# Patient Record
Sex: Male | Born: 1940 | Race: White | Hispanic: No | Marital: Married | Smoking: Never smoker
Health system: Southern US, Community
[De-identification: ages and names within clinical notes are randomized; demographics above are authoritative.]

## PROBLEM LIST (undated history)

## (undated) DIAGNOSIS — E119 Type 2 diabetes mellitus without complications: Secondary | ICD-10-CM

## (undated) DIAGNOSIS — F319 Bipolar disorder, unspecified: Secondary | ICD-10-CM

## (undated) DIAGNOSIS — F419 Anxiety disorder, unspecified: Secondary | ICD-10-CM

## (undated) DIAGNOSIS — F32A Depression, unspecified: Secondary | ICD-10-CM

## (undated) DIAGNOSIS — I10 Essential (primary) hypertension: Secondary | ICD-10-CM

## (undated) DIAGNOSIS — Z87442 Personal history of urinary calculi: Secondary | ICD-10-CM

## (undated) DIAGNOSIS — M1990 Unspecified osteoarthritis, unspecified site: Secondary | ICD-10-CM

## (undated) DIAGNOSIS — F329 Major depressive disorder, single episode, unspecified: Secondary | ICD-10-CM

## (undated) DIAGNOSIS — C4442 Squamous cell carcinoma of skin of scalp and neck: Secondary | ICD-10-CM

## (undated) HISTORY — DX: Anxiety disorder, unspecified: F41.9

## (undated) HISTORY — DX: Essential (primary) hypertension: I10

## (undated) HISTORY — PX: HIP FRACTURE SURGERY: SHX118

## (undated) HISTORY — DX: Depression, unspecified: F32.A

## (undated) HISTORY — PX: VASECTOMY: SHX75

## (undated) HISTORY — PX: HERNIA REPAIR: SHX51

## (undated) HISTORY — DX: Major depressive disorder, single episode, unspecified: F32.9

## (undated) HISTORY — DX: Unspecified osteoarthritis, unspecified site: M19.90

## (undated) HISTORY — PX: BACK SURGERY: SHX140

---

## 2001-10-02 ENCOUNTER — Other Ambulatory Visit (HOSPITAL_COMMUNITY): Admission: RE | Admit: 2001-10-02 | Discharge: 2001-10-13 | Payer: Self-pay | Admitting: Psychiatry

## 2003-03-31 ENCOUNTER — Inpatient Hospital Stay (HOSPITAL_COMMUNITY): Admission: EM | Admit: 2003-03-31 | Discharge: 2003-04-06 | Payer: Self-pay | Admitting: Psychiatry

## 2003-04-07 ENCOUNTER — Other Ambulatory Visit (HOSPITAL_COMMUNITY): Admission: RE | Admit: 2003-04-07 | Discharge: 2003-04-15 | Payer: Self-pay | Admitting: Psychiatry

## 2004-11-24 ENCOUNTER — Inpatient Hospital Stay: Payer: Self-pay | Admitting: Psychiatry

## 2004-12-04 ENCOUNTER — Ambulatory Visit: Payer: Self-pay | Admitting: Psychiatry

## 2009-08-12 ENCOUNTER — Ambulatory Visit (HOSPITAL_COMMUNITY): Admission: RE | Admit: 2009-08-12 | Discharge: 2009-08-12 | Payer: Self-pay | Admitting: Urology
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
## 2010-07-28 NOTE — H&P (Signed)
NAME:  COOPER, MORONEY                          ACCOUNT NO.:  [id number]   MEDICAL RECORD NO.:  [id number]                   PATIENT TYPE:  IPS   LOCATION:  0500                                 FACILITY:  [facility]   PHYSICIAN:  Geoffery Lyons, M.D.                   DATE OF BIRTH:  [birth year]   DATE OF ADMISSION:  03/31/2003  DATE OF DISCHARGE:                                HISTORY & PHYSICAL   IDENTIFYING INFORMATION:  A 70 year old married white male, voluntarily  admitted on March 31, 2003.   HISTORY OF PRESENT ILLNESS:  The patient presents with a history of anger  and homicidal ideation towards his wife with plans to smother her.  He has  been having thoughts the last week to hurt himself while he was on vacation,  jumping off a swinging bridge.  He reports he has been having depressive  anger, demanding and feeling out of line with his behavior.  He states he  was on vacation.  He states while he was there the fireplace was not  working.  He states that he was getting obsessed about getting this fixed.  He was talking to his congressman and the managers trying to get this fixed.  He also states he was at a restaurant with his family.  He states when he  was not served as quick as he thought, he made noise with his glass, he was  yelling at the waitress.  He is having homicidal thoughts towards his wife  because he states that she shows little emotional support.  He is currently  denying any suicidal ideation.  He has lost about 20-30 pounds recently.  The patient is also having problems with his job.  He is currently on FMLA  from his work at [employer].   PAST PSYCHIATRIC HISTORY:  Has been at [facility] where he was  hospitalized for anxiety.  He is currently seeing Dr. Jodi Marble who is a  psychiatrist.   SOCIAL HISTORY:  He is a 70 year old married white male, married for 33  years, first marriage.  Has 2 children, 2 daughters ages 42 and 31.  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  He denies any legal problems.   FAMILY HISTORY:  Hypertension.  He also states that his great aunt was  hospitalized for some time for psychiatric illnesses.  He is unsure of any  more details.   ALCOHOL DRUG HISTORY:  He is a nonsmoker.  He denies any alcohol or drug  use.   PAST MEDICAL HISTORY:  Primary care Grey[facility] Felter is Dr. Fara Chute in [place].  Medical problems are hypertension.   MEDICATIONS:  He is currently on Depakote 1000 mg p.o. q.h.s., Toprol XL 100  mg daily, Ativan and Doxepin.  He was on Ritalin and was taken off about 2  weeks ago.  DRUG ALLERGIES:  PROZAC, the patient reports violent side effects, also  ASPIRIN where he states he gets ringing in his ears.   PHYSICAL EXAMINATION:  Performed.  This is an unkempt middle-aged male,  unshaven, no significant findings.  Neurologically intact, nonfocal  neurological findings.  No tics or tremors.   LABORATORY DATA:  Glucose was 125, CBC is within normal limits.  TSH is  0.822.   MENTAL STATUS EXAM:  He is an alert, unkempt middle-aged male, cooperative,  direct eye contact.  Speech is clear, rambling some.  Mood:  The patient  feels very angry.  He states that at one time he went to put his fist  through a wall.  His affect is full range of emotions, somewhat labile,  irritable and then apologetic, smiling inappropriately at times.  Thought  processes are coherent, appears to be no evidence of psychosis.  He is  obsessing over the fact that the fireplace is not working right while he is  on vacation.  Cognitive intact, decreased concentration,  Judgment is fair,  insight is fair.   ADMISSION DIAGNOSES:   AXIS I:  Bipolar disorder.   AXIS II:  Deferred.   AXIS III:  Hypertension.   AXIS IV:  Problems with primary support group, occupation, other  psychosocial problems.   AXIS V:  Current is 30, past year 70.   PLAN:  Voluntary admission for homicidal  ideation.  Contract for safety,  check every 15 minutes.  We will stabilize mood and thinking so the patient  can be safe.  Will continue with Depakote, check Depakote levels for  therapeutic range.  Will contact Dr. Jodi Marble for any recommendations.  We  will initiate an anti-psychotic for obsessive thinking, anger and mood  stability.  We will have a family session prior to discharge.   TENTATIVE LENGTH OF CARE:  3-5 days.     Landry Corporal, N.P.                       Geoffery Lyons, M.D.    JO/MEDQ  D:  04/01/2003  T:  04/01/2003  Job:  [id number]

## 2016-06-27 DIAGNOSIS — S40861A Insect bite (nonvenomous) of right upper arm, initial encounter: Secondary | ICD-10-CM | POA: Diagnosis not present

## 2016-06-27 DIAGNOSIS — N183 Chronic kidney disease, stage 3 (moderate): Secondary | ICD-10-CM | POA: Diagnosis not present

## 2016-06-27 DIAGNOSIS — E782 Mixed hyperlipidemia: Secondary | ICD-10-CM | POA: Diagnosis not present

## 2016-06-27 DIAGNOSIS — I10 Essential (primary) hypertension: Secondary | ICD-10-CM | POA: Diagnosis not present

## 2016-06-27 DIAGNOSIS — L03113 Cellulitis of right upper limb: Secondary | ICD-10-CM | POA: Diagnosis not present

## 2016-06-27 DIAGNOSIS — E119 Type 2 diabetes mellitus without complications: Secondary | ICD-10-CM | POA: Diagnosis not present

## 2016-07-03 DIAGNOSIS — N183 Chronic kidney disease, stage 3 (moderate): Secondary | ICD-10-CM | POA: Diagnosis not present

## 2016-07-03 DIAGNOSIS — I10 Essential (primary) hypertension: Secondary | ICD-10-CM | POA: Diagnosis not present

## 2016-07-03 DIAGNOSIS — R634 Abnormal weight loss: Secondary | ICD-10-CM | POA: Diagnosis not present

## 2016-07-03 DIAGNOSIS — G3184 Mild cognitive impairment, so stated: Secondary | ICD-10-CM | POA: Diagnosis not present

## 2016-07-03 DIAGNOSIS — G20 Parkinson's disease: Secondary | ICD-10-CM | POA: Diagnosis not present

## 2016-07-03 DIAGNOSIS — E782 Mixed hyperlipidemia: Secondary | ICD-10-CM | POA: Diagnosis not present

## 2016-07-03 DIAGNOSIS — K8080 Other cholelithiasis without obstruction: Secondary | ICD-10-CM | POA: Diagnosis not present

## 2016-07-03 DIAGNOSIS — F3175 Bipolar disorder, in partial remission, most recent episode depressed: Secondary | ICD-10-CM | POA: Diagnosis not present

## 2016-07-24 DIAGNOSIS — J0101 Acute recurrent maxillary sinusitis: Secondary | ICD-10-CM | POA: Diagnosis not present

## 2016-07-24 DIAGNOSIS — J069 Acute upper respiratory infection, unspecified: Secondary | ICD-10-CM | POA: Diagnosis not present

## 2016-07-24 DIAGNOSIS — Z6830 Body mass index (BMI) 30.0-30.9, adult: Secondary | ICD-10-CM | POA: Diagnosis not present

## 2016-12-25 DIAGNOSIS — N183 Chronic kidney disease, stage 3 (moderate): Secondary | ICD-10-CM | POA: Diagnosis not present

## 2016-12-25 DIAGNOSIS — F3175 Bipolar disorder, in partial remission, most recent episode depressed: Secondary | ICD-10-CM | POA: Diagnosis not present

## 2016-12-25 DIAGNOSIS — E782 Mixed hyperlipidemia: Secondary | ICD-10-CM | POA: Diagnosis not present

## 2016-12-25 DIAGNOSIS — R7301 Impaired fasting glucose: Secondary | ICD-10-CM | POA: Diagnosis not present

## 2016-12-25 DIAGNOSIS — G20 Parkinson's disease: Secondary | ICD-10-CM | POA: Diagnosis not present

## 2016-12-25 DIAGNOSIS — I10 Essential (primary) hypertension: Secondary | ICD-10-CM | POA: Diagnosis not present

## 2016-12-25 DIAGNOSIS — E7800 Pure hypercholesterolemia, unspecified: Secondary | ICD-10-CM | POA: Diagnosis not present

## 2016-12-28 DIAGNOSIS — K8080 Other cholelithiasis without obstruction: Secondary | ICD-10-CM | POA: Diagnosis not present

## 2016-12-28 DIAGNOSIS — R7301 Impaired fasting glucose: Secondary | ICD-10-CM | POA: Diagnosis not present

## 2016-12-28 DIAGNOSIS — R634 Abnormal weight loss: Secondary | ICD-10-CM | POA: Diagnosis not present

## 2016-12-28 DIAGNOSIS — N401 Enlarged prostate with lower urinary tract symptoms: Secondary | ICD-10-CM | POA: Diagnosis not present

## 2016-12-28 DIAGNOSIS — I10 Essential (primary) hypertension: Secondary | ICD-10-CM | POA: Diagnosis not present

## 2016-12-28 DIAGNOSIS — F3175 Bipolar disorder, in partial remission, most recent episode depressed: Secondary | ICD-10-CM | POA: Diagnosis not present

## 2016-12-28 DIAGNOSIS — Z0001 Encounter for general adult medical examination with abnormal findings: Secondary | ICD-10-CM | POA: Diagnosis not present

## 2016-12-28 DIAGNOSIS — N183 Chronic kidney disease, stage 3 (moderate): Secondary | ICD-10-CM | POA: Diagnosis not present

## 2016-12-28 DIAGNOSIS — G3184 Mild cognitive impairment, so stated: Secondary | ICD-10-CM | POA: Diagnosis not present

## 2016-12-28 DIAGNOSIS — G20 Parkinson's disease: Secondary | ICD-10-CM | POA: Diagnosis not present

## 2016-12-28 DIAGNOSIS — R5383 Other fatigue: Secondary | ICD-10-CM | POA: Diagnosis not present

## 2016-12-28 DIAGNOSIS — E782 Mixed hyperlipidemia: Secondary | ICD-10-CM | POA: Diagnosis not present

## 2017-05-21 DIAGNOSIS — E1122 Type 2 diabetes mellitus with diabetic chronic kidney disease: Secondary | ICD-10-CM | POA: Diagnosis not present

## 2017-05-21 DIAGNOSIS — E7800 Pure hypercholesterolemia, unspecified: Secondary | ICD-10-CM | POA: Diagnosis not present

## 2017-05-21 DIAGNOSIS — N183 Chronic kidney disease, stage 3 (moderate): Secondary | ICD-10-CM | POA: Diagnosis not present

## 2017-05-21 DIAGNOSIS — R7301 Impaired fasting glucose: Secondary | ICD-10-CM | POA: Diagnosis not present

## 2017-05-21 DIAGNOSIS — E782 Mixed hyperlipidemia: Secondary | ICD-10-CM | POA: Diagnosis not present

## 2017-05-21 DIAGNOSIS — G20 Parkinson's disease: Secondary | ICD-10-CM | POA: Diagnosis not present

## 2017-05-21 DIAGNOSIS — I10 Essential (primary) hypertension: Secondary | ICD-10-CM | POA: Diagnosis not present

## 2017-05-21 DIAGNOSIS — R5383 Other fatigue: Secondary | ICD-10-CM | POA: Diagnosis not present

## 2017-05-21 DIAGNOSIS — F3175 Bipolar disorder, in partial remission, most recent episode depressed: Secondary | ICD-10-CM | POA: Diagnosis not present

## 2017-05-24 DIAGNOSIS — R634 Abnormal weight loss: Secondary | ICD-10-CM | POA: Diagnosis not present

## 2017-05-24 DIAGNOSIS — F3175 Bipolar disorder, in partial remission, most recent episode depressed: Secondary | ICD-10-CM | POA: Diagnosis not present

## 2017-05-24 DIAGNOSIS — E782 Mixed hyperlipidemia: Secondary | ICD-10-CM | POA: Diagnosis not present

## 2017-05-24 DIAGNOSIS — Z23 Encounter for immunization: Secondary | ICD-10-CM | POA: Diagnosis not present

## 2017-05-24 DIAGNOSIS — G20 Parkinson's disease: Secondary | ICD-10-CM | POA: Diagnosis not present

## 2017-05-24 DIAGNOSIS — I10 Essential (primary) hypertension: Secondary | ICD-10-CM | POA: Diagnosis not present

## 2017-05-24 DIAGNOSIS — Z6831 Body mass index (BMI) 31.0-31.9, adult: Secondary | ICD-10-CM | POA: Diagnosis not present

## 2017-05-24 DIAGNOSIS — G3184 Mild cognitive impairment, so stated: Secondary | ICD-10-CM | POA: Diagnosis not present

## 2017-11-13 DIAGNOSIS — L02811 Cutaneous abscess of head [any part, except face]: Secondary | ICD-10-CM | POA: Diagnosis not present

## 2017-11-13 DIAGNOSIS — Z6829 Body mass index (BMI) 29.0-29.9, adult: Secondary | ICD-10-CM | POA: Diagnosis not present

## 2017-11-21 DIAGNOSIS — R634 Abnormal weight loss: Secondary | ICD-10-CM | POA: Diagnosis not present

## 2017-11-21 DIAGNOSIS — E1122 Type 2 diabetes mellitus with diabetic chronic kidney disease: Secondary | ICD-10-CM | POA: Diagnosis not present

## 2017-11-21 DIAGNOSIS — E782 Mixed hyperlipidemia: Secondary | ICD-10-CM | POA: Diagnosis not present

## 2017-11-21 DIAGNOSIS — R7301 Impaired fasting glucose: Secondary | ICD-10-CM | POA: Diagnosis not present

## 2017-11-21 DIAGNOSIS — R5383 Other fatigue: Secondary | ICD-10-CM | POA: Diagnosis not present

## 2017-11-21 DIAGNOSIS — N183 Chronic kidney disease, stage 3 (moderate): Secondary | ICD-10-CM | POA: Diagnosis not present

## 2017-11-21 DIAGNOSIS — I10 Essential (primary) hypertension: Secondary | ICD-10-CM | POA: Diagnosis not present

## 2017-11-21 DIAGNOSIS — E7800 Pure hypercholesterolemia, unspecified: Secondary | ICD-10-CM | POA: Diagnosis not present

## 2017-11-27 DIAGNOSIS — Z23 Encounter for immunization: Secondary | ICD-10-CM | POA: Diagnosis not present

## 2017-11-27 DIAGNOSIS — R634 Abnormal weight loss: Secondary | ICD-10-CM | POA: Diagnosis not present

## 2017-11-27 DIAGNOSIS — N183 Chronic kidney disease, stage 3 (moderate): Secondary | ICD-10-CM | POA: Diagnosis not present

## 2017-11-27 DIAGNOSIS — G20 Parkinson's disease: Secondary | ICD-10-CM | POA: Diagnosis not present

## 2017-11-27 DIAGNOSIS — E782 Mixed hyperlipidemia: Secondary | ICD-10-CM | POA: Diagnosis not present

## 2017-11-27 DIAGNOSIS — G3184 Mild cognitive impairment, so stated: Secondary | ICD-10-CM | POA: Diagnosis not present

## 2017-11-27 DIAGNOSIS — Z6829 Body mass index (BMI) 29.0-29.9, adult: Secondary | ICD-10-CM | POA: Diagnosis not present

## 2017-11-27 DIAGNOSIS — I10 Essential (primary) hypertension: Secondary | ICD-10-CM | POA: Diagnosis not present

## 2017-12-04 DIAGNOSIS — C4442 Squamous cell carcinoma of skin of scalp and neck: Secondary | ICD-10-CM | POA: Diagnosis not present

## 2017-12-04 DIAGNOSIS — L0100 Impetigo, unspecified: Secondary | ICD-10-CM | POA: Diagnosis not present

## 2017-12-04 DIAGNOSIS — D485 Neoplasm of uncertain behavior of skin: Secondary | ICD-10-CM | POA: Diagnosis not present

## 2017-12-09 DIAGNOSIS — L0100 Impetigo, unspecified: Secondary | ICD-10-CM | POA: Diagnosis not present

## 2017-12-09 DIAGNOSIS — C4442 Squamous cell carcinoma of skin of scalp and neck: Secondary | ICD-10-CM | POA: Diagnosis not present

## 2017-12-11 DIAGNOSIS — C4442 Squamous cell carcinoma of skin of scalp and neck: Secondary | ICD-10-CM | POA: Diagnosis not present

## 2017-12-17 ENCOUNTER — Encounter: Payer: Self-pay | Admitting: Plastic Surgery

## 2017-12-17 ENCOUNTER — Ambulatory Visit (INDEPENDENT_AMBULATORY_CARE_PROVIDER_SITE_OTHER): Payer: PPO | Admitting: Plastic Surgery

## 2017-12-17 VITALS — BP 118/62 | HR 85 | Resp 12 | Ht 70.0 in | Wt 196.0 lb

## 2017-12-17 DIAGNOSIS — D485 Neoplasm of uncertain behavior of skin: Secondary | ICD-10-CM | POA: Diagnosis not present

## 2017-12-17 DIAGNOSIS — C4442 Squamous cell carcinoma of skin of scalp and neck: Secondary | ICD-10-CM | POA: Diagnosis not present

## 2017-12-17 DIAGNOSIS — E041 Nontoxic single thyroid nodule: Secondary | ICD-10-CM | POA: Diagnosis not present

## 2017-12-17 NOTE — Addendum Note (Signed)
Addended by: Wallace Going on: 12/17/2017 02:47 PM   Modules accepted: Orders

## 2017-12-17 NOTE — Progress Notes (Addendum)
     Patient ID: Patrick Coleman, male    DOB: 1940-10-07, 77 y.o.   MRN: [id number]   Chief Complaint  Patient presents with  . 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  He has noticed a sore area on the superior posterior portion of the scalp for several months.  It was getting more sore and tender.  It was biopsied and found to be a SCC with concern that it was tethered to the bone.  The area is now 3 x 3 cm in size with rolled edges. It is tender to touch.  He is keeping it covered at the moment.  His wife is helping to keep it clean.  There is no sign of inflammed or tender cervical lymph nodes.   History and sheet is scanned.   Review of Systems  Constitutional: Negative.  Negative for activity change and appetite change.  HENT: Negative.   Eyes: Negative.   Respiratory: Negative.  Negative for shortness of breath.   Cardiovascular: Negative.   Gastrointestinal: Negative.   Genitourinary: Negative.   Musculoskeletal: Negative.  Negative for back pain.  Skin: Positive for color change and wound.  Neurological: Negative for dizziness, facial asymmetry, speech difficulty, light-headedness and headaches.  Hematological: Negative.   Psychiatric/Behavioral: Negative.     History reviewed. No pertinent past medical history.  History reviewed. No pertinent surgical history.    Current Outpatient Medications:  .  doxazosin (CARDURA) 4 MG tablet, Take 4 mg by mouth at bedtime., Disp: , Rfl: 4 .  doxycycline (VIBRA-TABS) 100 MG tablet, TAKE 1 TABLET BY MOUTH TWICE DAILY WITH FOOD FOR FIVE DAYS THEN ONE TABLET BY MOUTH FOR 20 DAYS, Disp: , Rfl: 0 .  lamoTRIgine (LAMICTAL) 150 MG tablet, Take 150 mg by mouth at bedtime., Disp: , Rfl: 3 .  lithium carbonate (ESKALITH) 450 MG CR tablet, Take 450 mg by mouth daily., Disp: , Rfl: 4 .  metoprolol tartrate (LOPRESSOR) 25 MG tablet, Take 25 mg by mouth daily.,  Disp: , Rfl: 7 .  OLANZapine (ZYPREXA) 10 MG tablet, TAKE 1 AND 1/2 TABLETS BY MOUTH AT BEDTIME, Disp: , Rfl: 3 .  sertraline (ZOLOFT) 50 MG tablet, Take 50 mg by mouth daily., Disp: , Rfl: 3 .  tamsulosin (FLOMAX) 0.4 MG CAPS capsule, Take 0.4 mg by mouth daily., Disp: , Rfl: 3   Objective:   Vitals:   12/17/17 1141  BP: 118/62  Pulse: 85  Resp: 12  SpO2: 96%    Physical Exam  Constitutional: He is oriented to person, place, and time. He appears well-developed.  HENT:  Head: Normocephalic.    Right Ear: External ear normal.  Left Ear: External ear normal.  Eyes: Pupils are equal, round, and reactive to light.  Cardiovascular: Normal rate.  Pulmonary/Chest: Effort normal. No respiratory distress.  Neurological: He is alert and oriented to person, place, and time.  Skin: Skin is warm.  Psychiatric: He has a normal mood and affect. His behavior is normal. Judgment and thought content normal.    Assessment & Plan:  Squamous cell carcinoma of scalp  Recommend excision of the area with outer table excision, path evaluation.  Acell for aid in healing and then likely skin graft.  Family and patient in agreement.  Discussed with Dr. Danielle Dess.  Will get CT of head and neck.  Bricelyn, DO

## 2017-12-24 ENCOUNTER — Other Ambulatory Visit: Payer: Self-pay | Admitting: Plastic Surgery

## 2017-12-24 ENCOUNTER — Ambulatory Visit (HOSPITAL_COMMUNITY)
Admission: RE | Admit: 2017-12-24 | Discharge: 2017-12-24 | Disposition: A | Discharge status: Home / Self Care | Payer: PPO | Source: Ambulatory Visit | Attending: Plastic Surgery | Admitting: Plastic Surgery

## 2017-12-24 DIAGNOSIS — D485 Neoplasm of uncertain behavior of skin: Secondary | ICD-10-CM

## 2017-12-24 DIAGNOSIS — E0789 Other specified disorders of thyroid: Secondary | ICD-10-CM | POA: Diagnosis not present

## 2017-12-24 DIAGNOSIS — M542 Cervicalgia: Secondary | ICD-10-CM | POA: Insufficient documentation

## 2017-12-24 DIAGNOSIS — C4442 Squamous cell carcinoma of skin of scalp and neck: Secondary | ICD-10-CM | POA: Diagnosis not present

## 2017-12-24 LAB — POCT I-STAT CREATININE: CREATININE: 1.3 mg/dL — AB (ref 0.61–1.24)

## 2017-12-24 MED ORDER — IOHEXOL 300 MG/ML  SOLN
75.0000 mL | Freq: Once | INTRAMUSCULAR | Status: AC | PRN
  Administered 2017-12-24: 75 mL via INTRAVENOUS

## 2017-12-26 NOTE — Addendum Note (Signed)
Addended by: Wallace Going on: 12/26/2017 08:02 AM   Modules accepted: Orders

## 2018-01-01 ENCOUNTER — Telehealth: Payer: Self-pay | Admitting: Plastic Surgery

## 2018-01-01 NOTE — Telephone Encounter (Signed)
Made in error

## 2018-01-03 ENCOUNTER — Ambulatory Visit (INDEPENDENT_AMBULATORY_CARE_PROVIDER_SITE_OTHER): Payer: PPO | Admitting: Plastic Surgery

## 2018-01-03 ENCOUNTER — Encounter: Payer: Self-pay | Admitting: Plastic Surgery

## 2018-01-03 VITALS — BP 120/76 | HR 94 | Resp 14

## 2018-01-03 DIAGNOSIS — C4442 Squamous cell carcinoma of skin of scalp and neck: Secondary | ICD-10-CM

## 2018-01-03 DIAGNOSIS — E041 Nontoxic single thyroid nodule: Secondary | ICD-10-CM | POA: Insufficient documentation

## 2018-01-03 NOTE — Progress Notes (Signed)
   Subjective:    Patient ID: Patrick Coleman, male    DOB: 1940/05/19, 77 y.o.   MRN: [id number]  The patient is a 77 year old male here with his wife for follow-up after further evaluation of his scalp squamous cell carcinoma.  He underwent a CT of head and neck and fortunately it was negative for neck or nodal involvement.  He did have a lesion noted on his thyroid.  We will continue to work this up as well and have ordered a ultrasound.  It is approximately the same size 3 x 3 it still has a little bit of ooze with raised borders and an ulcerated central portion.  He has been doing well otherwise and not had any recent illnesses.  He denies any other symptoms.  Review of Systems  Constitutional: Negative.  Negative for activity change.  HENT: Negative.   Respiratory: Negative.   Cardiovascular: Negative.   Gastrointestinal: Negative.   Endocrine: Negative.   Genitourinary: Negative.   Musculoskeletal: Negative.   Skin: Positive for color change and wound.  Neurological: Negative.   Hematological: Negative.   Psychiatric/Behavioral: Negative.        Objective:   Physical Exam  Constitutional: He is oriented to person, place, and time. He appears well-developed and well-nourished.  HENT:  Head: Normocephalic.  Eyes: Pupils are equal, round, and reactive to light. EOM are normal.  Cardiovascular: Normal rate.  Pulmonary/Chest: Effort normal.  Neurological: He is alert and oriented to person, place, and time.  Skin: Skin is warm.  Psychiatric: He has a normal mood and affect. His behavior is normal.      Assessment & Plan:  Squamous cell carcinoma of scalp  Thyroid nodule Plan for excision of the squamous cell carcinoma with past evaluation.  We will plan on placing acell for granulation tissue formation while awaiting the final path.  Patient is in agreement with this plan we will likely use a 5 x 5 cm piece of ACell with a gram of powder

## 2018-01-03 NOTE — H&P (View-Only) (Signed)
   Subjective:    Patient ID: Patrick Coleman, male    DOB: 1940-12-26, 77 y.o.   MRN: [id number]  The patient is a 77 year old male here with his wife for follow-up after further evaluation of his scalp squamous cell carcinoma.  He underwent a CT of head and neck and fortunately it was negative for neck or nodal involvement.  He did have a lesion noted on his thyroid.  We will continue to work this up as well and have ordered a ultrasound.  It is approximately the same size 3 x 3 it still has a little bit of ooze with raised borders and an ulcerated central portion.  He has been doing well otherwise and not had any recent illnesses.  He denies any other symptoms.  Review of Systems  Constitutional: Negative.  Negative for activity change.  HENT: Negative.   Respiratory: Negative.   Cardiovascular: Negative.   Gastrointestinal: Negative.   Endocrine: Negative.   Genitourinary: Negative.   Musculoskeletal: Negative.   Skin: Positive for color change and wound.  Neurological: Negative.   Hematological: Negative.   Psychiatric/Behavioral: Negative.        Objective:   Physical Exam  Constitutional: He is oriented to person, place, and time. He appears well-developed and well-nourished.  HENT:  Head: Normocephalic.  Eyes: Pupils are equal, round, and reactive to light. EOM are normal.  Cardiovascular: Normal rate.  Pulmonary/Chest: Effort normal.  Neurological: He is alert and oriented to person, place, and time.  Skin: Skin is warm.  Psychiatric: He has a normal mood and affect. His behavior is normal.      Assessment & Plan:  Squamous cell carcinoma of scalp  Thyroid nodule Plan for excision of the squamous cell carcinoma with past evaluation.  We will plan on placing acell for granulation tissue formation while awaiting the final path.  Patient is in agreement with this plan we will likely use a 5 x 5 cm piece of ACell with a gram of powder

## 2018-01-07 NOTE — Pre-Procedure Instructions (Signed)
Patrick Coleman  01/07/2018      [facility] - [address], [place], [place] [address] [place] [address] Phone: [phone] Fax: [phone]    Your procedure is scheduled on November 4th.  Report to [facility] Admitting at 1100 A.M.  Call this number if you have problems the morning of surgery:  [phone]   Remember:  Do not eat or drink after midnight.     Take these medicines the morning of surgery with A SIP OF WATER   buPROPion (WELLBUTRIN SR)  carbidopa-levodopa (SINEMET IR)   metoprolol tartrate (LOPRESSOR)  sertraline (ZOLOFT)  7 days prior to surgery STOP taking any Aspirin(unless otherwise instructed by your surgeon), Aleve, Naproxen, Ibuprofen, Motrin, Advil, Goody's, BC's, all herbal medications, fish oil, and all vitamins    Do not wear jewelry.  Do not wear lotions, powders, or colognes, or deodorant.  Men may shave face and neck.  Do not bring valuables to the hospital.  [facility] is not responsible for any belongings or valuables.  Contacts, dentures or bridgework may not be worn into surgery.  Leave your suitcase in the car.  After surgery it may be brought to your room.  For patients admitted to the hospital, discharge time will be determined by your treatment team.  Patients discharged the day of surgery will not be allowed to drive home.    [place]- Preparing For Surgery  Before surgery, you can play an important role. Because skin is not sterile, your skin needs to be as free of germs as possible. You can reduce the number of germs on your skin by washing with CHG (chlorahexidine gluconate) Soap before surgery.  CHG is an antiseptic cleaner which kills germs and bonds with the skin to continue killing germs even after washing.    Oral Hygiene is also important to reduce your risk of infection.  Remember - BRUSH YOUR TEETH THE MORNING OF SURGERY WITH YOUR REGULAR TOOTHPASTE  Please do not use if  you have an allergy to CHG or antibacterial soaps. If your skin becomes reddened/irritated stop using the CHG.  Do not shave (including legs and underarms) for at least 48 hours prior to first CHG shower. It is OK to shave your face.  Please follow these instructions carefully.   1. Shower the NIGHT BEFORE SURGERY and the MORNING OF SURGERY with CHG.   2. If you chose to wash your hair, wash your hair first as usual with your normal shampoo.  3. After you shampoo, rinse your hair and body thoroughly to remove the shampoo.  4. Use CHG as you would any other liquid soap. You can apply CHG directly to the skin and wash gently with a scrungie or a clean washcloth.   5. Apply the CHG Soap to your body ONLY FROM THE NECK DOWN.  Do not use on open wounds or open sores. Avoid contact with your eyes, ears, mouth and genitals (private parts). Wash Face and genitals (private parts)  with your normal soap.  6. Wash thoroughly, paying special attention to the area where your surgery will be performed.  7. Thoroughly rinse your body with warm water from the neck down.  8. DO NOT shower/wash with your normal soap after using and rinsing off the CHG Soap.  9. Pat yourself dry with a CLEAN TOWEL.  10. Wear CLEAN PAJAMAS to bed the night before surgery, wear comfortable clothes the  morning of surgery  11. Place CLEAN SHEETS on your bed the night of your first shower and DO NOT SLEEP WITH PETS.    Day of Surgery:  Do not apply any deodorants/lotions.  Please wear clean clothes to the hospital/surgery center.   Remember to brush your teeth WITH YOUR REGULAR TOOTHPASTE.    Please read over the following fact sheets that you were given.

## 2018-01-08 ENCOUNTER — Other Ambulatory Visit: Payer: Self-pay

## 2018-01-08 ENCOUNTER — Encounter (HOSPITAL_COMMUNITY)
Admission: RE | Admit: 2018-01-08 | Discharge: 2018-01-08 | Disposition: A | Discharge status: Home / Self Care | Payer: PPO | Source: Ambulatory Visit | Attending: Plastic Surgery | Admitting: Plastic Surgery

## 2018-01-08 ENCOUNTER — Encounter (HOSPITAL_COMMUNITY): Payer: Self-pay

## 2018-01-08 DIAGNOSIS — Z01818 Encounter for other preprocedural examination: Secondary | ICD-10-CM | POA: Insufficient documentation

## 2018-01-08 DIAGNOSIS — C4442 Squamous cell carcinoma of skin of scalp and neck: Secondary | ICD-10-CM | POA: Insufficient documentation

## 2018-01-08 DIAGNOSIS — Z79899 Other long term (current) drug therapy: Secondary | ICD-10-CM | POA: Insufficient documentation

## 2018-01-08 DIAGNOSIS — Z7982 Long term (current) use of aspirin: Secondary | ICD-10-CM | POA: Insufficient documentation

## 2018-01-08 HISTORY — DX: Type 2 diabetes mellitus without complications: E11.9

## 2018-01-08 HISTORY — DX: Squamous cell carcinoma of skin of scalp and neck: C44.42

## 2018-01-08 HISTORY — DX: Bipolar disorder, unspecified: F31.9

## 2018-01-08 HISTORY — DX: Personal history of urinary calculi: Z87.442

## 2018-01-08 LAB — CBC
HCT: 45.1 % (ref 39.0–52.0)
Hemoglobin: 14.2 g/dL (ref 13.0–17.0)
MCH: 31.4 pg (ref 26.0–34.0)
MCHC: 31.5 g/dL (ref 30.0–36.0)
MCV: 99.8 fL (ref 80.0–100.0)
NRBC: 0 % (ref 0.0–0.2)
PLATELETS: 126 10*3/uL — AB (ref 150–400)
RBC: 4.52 MIL/uL (ref 4.22–5.81)
RDW: 12.9 % (ref 11.5–15.5)
WBC: 5.3 10*3/uL (ref 4.0–10.5)

## 2018-01-08 LAB — BASIC METABOLIC PANEL
ANION GAP: 7 (ref 5–15)
BUN: 18 mg/dL (ref 8–23)
CALCIUM: 9.4 mg/dL (ref 8.9–10.3)
CO2: 23 mmol/L (ref 22–32)
CREATININE: 1.03 mg/dL (ref 0.61–1.24)
Chloride: 110 mmol/L (ref 98–111)
GFR calc Af Amer: >60 mL/min (ref >60)
Glucose, Bld: 95 mg/dL (ref 70–99)
Potassium: 4.6 mmol/L (ref 3.5–5.1)
SODIUM: 140 mmol/L (ref 135–145)

## 2018-01-08 LAB — GLUCOSE, CAPILLARY: GLUCOSE-CAPILLARY: 81 mg/dL (ref 70–99)

## 2018-01-08 LAB — HEMOGLOBIN A1C
HEMOGLOBIN A1C: 4.8 % (ref 4.8–5.6)
MEAN PLASMA GLUCOSE: 91.06 mg/dL

## 2018-01-08 NOTE — Progress Notes (Addendum)
PCP - Dr. Consuello Masse  Cardiologist - Denies  Chest x-ray - Denies  EKG - 01/08/18  Stress Test - Denies  ECHO - Denies  Cardiac Cath - Denies  AICD- na PM- na LOOP- na  Sleep Study - Yes- Neg- Positive Stop Bang CPAP - None  LABS- 01/08/18: CBC,BMP  ASA- LD- 10/29, hold per Dr. Orma Flaming- 01/08/18 Fasting Blood Sugar - Today, 81 Checks Blood Sugar _0____ times a day- Pt controls with diet  Anesthesia- No  Pt denies having chest pain, sob, or fever at this time. All instructions explained to the pt, with a verbal understanding of the material. Pt agrees to go over the instructions while at home for a better understanding. The opportunity to ask questions was provided.

## 2018-01-08 NOTE — Progress Notes (Signed)
   01/08/18 1010  OBSTRUCTIVE SLEEP APNEA  Have you ever been diagnosed with sleep apnea through a sleep study? No  Do you snore loudly (loud enough to be heard through closed doors)?  1  Do you often feel tired, fatigued, or sleepy during the daytime (such as falling asleep during driving or talking to someone)? 0  Has anyone observed you stop breathing during your sleep? 0  Do you have, or are you being treated for high blood pressure? 1  BMI more than 35 kg/m2? 0  Age > 50 (1-yes) 1  Neck circumference greater than:Male 16 inches or larger, Male 17inches or larger? 1 (58)  Male Gender (Yes=1) 1  Obstructive Sleep Apnea Score 5  Score 5 or greater  Results sent to PCP

## 2018-01-13 ENCOUNTER — Encounter (HOSPITAL_COMMUNITY)
Admission: RE | Disposition: A | Discharge status: Home / Self Care | Payer: Self-pay | Source: Ambulatory Visit | Attending: Plastic Surgery

## 2018-01-13 ENCOUNTER — Ambulatory Visit (HOSPITAL_COMMUNITY): Payer: PPO | Admitting: Anesthesiology

## 2018-01-13 ENCOUNTER — Other Ambulatory Visit: Payer: Self-pay

## 2018-01-13 ENCOUNTER — Ambulatory Visit (HOSPITAL_COMMUNITY)
Admission: RE | Admit: 2018-01-13 | Discharge: 2018-01-13 | Disposition: A | Discharge status: Home / Self Care | Payer: PPO | Source: Ambulatory Visit | Attending: Plastic Surgery | Admitting: Plastic Surgery

## 2018-01-13 ENCOUNTER — Encounter (HOSPITAL_COMMUNITY): Payer: Self-pay

## 2018-01-13 DIAGNOSIS — C4442 Squamous cell carcinoma of skin of scalp and neck: Secondary | ICD-10-CM | POA: Insufficient documentation

## 2018-01-13 DIAGNOSIS — M1990 Unspecified osteoarthritis, unspecified site: Secondary | ICD-10-CM | POA: Insufficient documentation

## 2018-01-13 DIAGNOSIS — I10 Essential (primary) hypertension: Secondary | ICD-10-CM | POA: Insufficient documentation

## 2018-01-13 DIAGNOSIS — E119 Type 2 diabetes mellitus without complications: Secondary | ICD-10-CM | POA: Diagnosis not present

## 2018-01-13 HISTORY — PX: LIPOMA EXCISION: SHX5283

## 2018-01-13 HISTORY — PX: APPLICATION OF A-CELL OF HEAD/NECK: SHX6304

## 2018-01-13 LAB — GLUCOSE, CAPILLARY
GLUCOSE-CAPILLARY: 94 mg/dL (ref 70–99)
Glucose-Capillary: 86 mg/dL (ref 70–99)
Glucose-Capillary: 81 mg/dL (ref 70–99)

## 2018-01-13 SURGERY — EXCISION LIPOMA
Anesthesia: General | Site: Head

## 2018-01-13 MED ORDER — CEFAZOLIN SODIUM-DEXTROSE 2-4 GM/100ML-% IV SOLN
2.0000 g | INTRAVENOUS | Status: AC
  Administered 2018-01-13: 2 g via INTRAVENOUS

## 2018-01-13 MED ORDER — PROMETHAZINE HCL 25 MG/ML IJ SOLN: 6.2500 mg | INTRAMUSCULAR | Status: DC | PRN

## 2018-01-13 MED ORDER — BUPIVACAINE-EPINEPHRINE (PF) 0.25% -1:200000 IJ SOLN
INTRAMUSCULAR | Status: AC
  Filled 2018-01-13: qty 30

## 2018-01-13 MED ORDER — FENTANYL CITRATE (PF) 100 MCG/2ML IJ SOLN: 25.0000 ug | INTRAMUSCULAR | Status: DC | PRN

## 2018-01-13 MED ORDER — EPHEDRINE 5 MG/ML INJ
INTRAVENOUS | Status: AC
  Filled 2018-01-13: qty 10

## 2018-01-13 MED ORDER — ONDANSETRON HCL 4 MG/2ML IJ SOLN
INTRAMUSCULAR | Status: AC
  Filled 2018-01-13: qty 2

## 2018-01-13 MED ORDER — PHENYLEPHRINE HCL 10 MG/ML IJ SOLN
INTRAMUSCULAR | Status: DC | PRN
  Administered 2018-01-13 (×2): 80 ug via INTRAVENOUS

## 2018-01-13 MED ORDER — OXYCODONE HCL 5 MG PO TABS: 5.0000 mg | ORAL_TABLET | ORAL | Status: DC | PRN

## 2018-01-13 MED ORDER — MEPERIDINE HCL 50 MG/ML IJ SOLN: 6.2500 mg | INTRAMUSCULAR | Status: DC | PRN

## 2018-01-13 MED ORDER — BUPIVACAINE-EPINEPHRINE 0.25% -1:200000 IJ SOLN
INTRAMUSCULAR | Status: DC | PRN
  Administered 2018-01-13: 9 mL

## 2018-01-13 MED ORDER — EPHEDRINE SULFATE 50 MG/ML IJ SOLN
INTRAMUSCULAR | Status: DC | PRN
  Administered 2018-01-13 (×3): 10 mg via INTRAVENOUS

## 2018-01-13 MED ORDER — ONDANSETRON HCL 4 MG/2ML IJ SOLN
INTRAMUSCULAR | Status: DC | PRN
  Administered 2018-01-13: 4 mg via INTRAVENOUS

## 2018-01-13 MED ORDER — AMOXICILLIN-POT CLAVULANATE 500-125 MG PO TABS: 1.0000 | ORAL_TABLET | Freq: Three times a day (TID) | ORAL | 0 refills | Status: DC

## 2018-01-13 MED ORDER — HYDROCODONE-ACETAMINOPHEN 5-325 MG PO TABS: 1.0000 | ORAL_TABLET | Freq: Four times a day (QID) | ORAL | 0 refills | Status: AC | PRN

## 2018-01-13 MED ORDER — SODIUM CHLORIDE 0.9% FLUSH: 3.0000 mL | Freq: Two times a day (BID) | INTRAVENOUS | Status: DC

## 2018-01-13 MED ORDER — PROPOFOL 10 MG/ML IV BOLUS
INTRAVENOUS | Status: DC | PRN
  Administered 2018-01-13: 130 mg via INTRAVENOUS

## 2018-01-13 MED ORDER — CEFAZOLIN SODIUM-DEXTROSE 2-4 GM/100ML-% IV SOLN
INTRAVENOUS | Status: AC
  Filled 2018-01-13: qty 100

## 2018-01-13 MED ORDER — ACETAMINOPHEN 500 MG PO TABS: 500.0000 mg | ORAL_TABLET | Freq: Four times a day (QID) | ORAL | Status: DC

## 2018-01-13 MED ORDER — FENTANYL CITRATE (PF) 250 MCG/5ML IJ SOLN
INTRAMUSCULAR | Status: AC
  Filled 2018-01-13: qty 5

## 2018-01-13 MED ORDER — FENTANYL CITRATE (PF) 100 MCG/2ML IJ SOLN
INTRAMUSCULAR | Status: DC | PRN
  Administered 2018-01-13: 50 ug via INTRAVENOUS
  Administered 2018-01-13 (×2): 25 ug via INTRAVENOUS

## 2018-01-13 MED ORDER — PHENYLEPHRINE 40 MCG/ML (10ML) SYRINGE FOR IV PUSH (FOR BLOOD PRESSURE SUPPORT)
PREFILLED_SYRINGE | INTRAVENOUS | Status: AC
  Filled 2018-01-13: qty 10

## 2018-01-13 MED ORDER — LIDOCAINE HCL (CARDIAC) PF 100 MG/5ML IV SOSY
PREFILLED_SYRINGE | INTRAVENOUS | Status: DC | PRN
  Administered 2018-01-13: 100 mg via INTRAVENOUS

## 2018-01-13 MED ORDER — LIDOCAINE 2% (20 MG/ML) 5 ML SYRINGE
INTRAMUSCULAR | Status: AC
  Filled 2018-01-13: qty 5

## 2018-01-13 MED ORDER — SODIUM CHLORIDE 0.9% FLUSH: 3.0000 mL | INTRAVENOUS | Status: DC | PRN

## 2018-01-13 MED ORDER — PROPOFOL 10 MG/ML IV BOLUS
INTRAVENOUS | Status: AC
  Filled 2018-01-13: qty 20

## 2018-01-13 MED ORDER — SODIUM CHLORIDE 0.9 % IV SOLN: 250.0000 mL | INTRAVENOUS | Status: DC | PRN

## 2018-01-13 MED ORDER — BACITRACIN ZINC 500 UNIT/GM EX OINT
TOPICAL_OINTMENT | CUTANEOUS | Status: AC
  Filled 2018-01-13: qty 28.35

## 2018-01-13 MED ORDER — OXYCODONE HCL 5 MG PO TABS: 5.0000 mg | ORAL_TABLET | Freq: Once | ORAL | Status: DC | PRN

## 2018-01-13 MED ORDER — OXYCODONE HCL 5 MG/5ML PO SOLN: 5.0000 mg | Freq: Once | ORAL | Status: DC | PRN

## 2018-01-13 MED ORDER — 0.9 % SODIUM CHLORIDE (POUR BTL) OPTIME
TOPICAL | Status: DC | PRN
  Administered 2018-01-13: 1000 mL

## 2018-01-13 MED ORDER — LIDOCAINE-EPINEPHRINE 1 %-1:100000 IJ SOLN
INTRAMUSCULAR | Status: AC
  Filled 2018-01-13: qty 1

## 2018-01-13 MED ORDER — GLYCOPYRROLATE PF 0.2 MG/ML IJ SOSY
PREFILLED_SYRINGE | INTRAMUSCULAR | Status: AC
  Filled 2018-01-13: qty 1

## 2018-01-13 MED ORDER — LACTATED RINGERS IV SOLN
INTRAVENOUS | Status: DC
  Administered 2018-01-13: 11:00:00 via INTRAVENOUS

## 2018-01-13 SURGICAL SUPPLY — 51 items
BLADE 10 SAFETY STRL DISP (BLADE) IMPLANT
BNDG CONFORM 2 STRL LF (GAUZE/BANDAGES/DRESSINGS) IMPLANT
BNDG GAUZE ELAST 4 BULKY (GAUZE/BANDAGES/DRESSINGS) IMPLANT
CANISTER SUCT 3000ML PPV (MISCELLANEOUS) ×3 IMPLANT
CLEANER TIP ELECTROSURG 2X2 (MISCELLANEOUS) ×3 IMPLANT
CONT SPEC 4OZ CLIKSEAL STRL BL (MISCELLANEOUS) ×3 IMPLANT
COVER SURGICAL LIGHT HANDLE (MISCELLANEOUS) ×3 IMPLANT
COVER WAND RF STERILE (DRAPES) ×3 IMPLANT
DRSG EMULSION OIL 3X3 NADH (GAUZE/BANDAGES/DRESSINGS) IMPLANT
ELECT CAUTERY BLADE 6.4 (BLADE) ×3 IMPLANT
ELECT COATED BLADE 2.86 ST (ELECTRODE) IMPLANT
ELECT NEEDLE BLADE 2-5/6 (NEEDLE) ×3 IMPLANT
ELECT NEEDLE TIP 2.8 STRL (NEEDLE) IMPLANT
ELECT REM PT RETURN 9FT ADLT (ELECTROSURGICAL) ×3
ELECTRODE REM PT RTRN 9FT ADLT (ELECTROSURGICAL) ×1 IMPLANT
GAUZE 4X4 16PLY RFD (DISPOSABLE) IMPLANT
GAUZE SPONGE 4X4 12PLY STRL (GAUZE/BANDAGES/DRESSINGS) ×3 IMPLANT
GAUZE SPONGE 4X4 12PLY STRL LF (GAUZE/BANDAGES/DRESSINGS) ×3 IMPLANT
GLOVE BIO SURGEON STRL SZ 6.5 (GLOVE) ×6 IMPLANT
GLOVE BIO SURGEONS STRL SZ 6.5 (GLOVE) ×3
GOWN STRL REUS W/ TWL LRG LVL3 (GOWN DISPOSABLE) ×2 IMPLANT
GOWN STRL REUS W/TWL LRG LVL3 (GOWN DISPOSABLE) ×4
KIT BASIN OR (CUSTOM PROCEDURE TRAY) ×3 IMPLANT
KIT TURNOVER KIT B (KITS) ×3 IMPLANT
MATRIX WOUND 3-LAYER 7X10 (Tissue) ×2 IMPLANT
MICROMATRIX 1000MG (Tissue) ×3 IMPLANT
NEEDLE HYPO 25GX1X1/2 BEV (NEEDLE) ×3 IMPLANT
NS IRRIG 1000ML POUR BTL (IV SOLUTION) ×3 IMPLANT
PACK SURGICAL SETUP 50X90 (CUSTOM PROCEDURE TRAY) ×3 IMPLANT
PAD ABD 8X10 STRL (GAUZE/BANDAGES/DRESSINGS) ×3 IMPLANT
PAD ARMBOARD 7.5X6 YLW CONV (MISCELLANEOUS) ×6 IMPLANT
PENCIL BUTTON HOLSTER BLD 10FT (ELECTRODE) ×3 IMPLANT
SOLUTION PARTIC MCRMTRX 1000MG (Tissue) ×1 IMPLANT
SPECIMEN JAR SMALL (MISCELLANEOUS) ×3 IMPLANT
SPONGE LAP 18X18 X RAY DECT (DISPOSABLE) IMPLANT
SUT CHROMIC 4 0 P 3 18 (SUTURE) ×3 IMPLANT
SUT ETHILON 4 0 PS 2 18 (SUTURE) ×3 IMPLANT
SUT ETHILON 5 0 P 3 18 (SUTURE) ×2
SUT NYLON ETHILON 5-0 P-3 1X18 (SUTURE) ×1 IMPLANT
SUT SILK 4 0 (SUTURE) ×2
SUT SILK 4-0 18XBRD TIE 12 (SUTURE) ×1 IMPLANT
SUT VIC AB 5-0 PS2 18 (SUTURE) ×9 IMPLANT
SYR BULB 3OZ (MISCELLANEOUS) IMPLANT
SYR BULB IRRIGATION 50ML (SYRINGE) IMPLANT
SYR CONTROL 10ML LL (SYRINGE) IMPLANT
TOWEL OR 17X24 6PK STRL BLUE (TOWEL DISPOSABLE) ×3 IMPLANT
TOWEL OR 17X26 10 PK STRL BLUE (TOWEL DISPOSABLE) ×3 IMPLANT
TRAY ENT MC OR (CUSTOM PROCEDURE TRAY) ×3 IMPLANT
WATER STERILE IRR 1000ML POUR (IV SOLUTION) ×6 IMPLANT
WOUND MATRIX 3-LAYER 7X10 (Tissue) ×1 IMPLANT
YANKAUER SUCT BULB TIP NO VENT (SUCTIONS) ×3 IMPLANT

## 2018-01-13 NOTE — Interval H&P Note (Signed)
History and Physical Interval Note:  01/13/2018 11:56 AM  Patrick Coleman  has presented today for surgery, with the diagnosis of Squamous Cell Carcinoma Of Scalp, thyroid nodule  The various methods of treatment have been discussed with the patient and family. After consideration of risks, benefits and other options for treatment, the patient has consented to  Procedure(s): Excision of squamous cell carcinoma (N/A) APPLICATION OF A-CELL OF HEAD/NECK (N/A) as a surgical intervention .  The patient's history has been reviewed, patient examined, no change in status, stable for surgery.  I have reviewed the patient's chart and labs.  Questions were answered to the patient's satisfaction.     Loel Lofty Dillingham

## 2018-01-13 NOTE — Anesthesia Procedure Notes (Signed)
Procedure Name: LMA Insertion Date/Time: 01/13/2018 12:44 PM Performed by: Riyaan Heroux T, CRNA Pre-anesthesia Checklist: Patient identified, Emergency Drugs available, Suction available and Patient being monitored Patient Re-evaluated:Patient Re-evaluated prior to induction Oxygen Delivery Method: Circle system utilized Preoxygenation: Pre-oxygenation with 100% oxygen Induction Type: IV induction Ventilation: Mask ventilation without difficulty LMA: LMA inserted LMA Size: 5.0 Number of attempts: 1 Airway Equipment and Method: Patient positioned with wedge pillow Placement Confirmation: breath sounds checked- equal and bilateral Tube secured with: Tape Dental Injury: Teeth and Oropharynx as per pre-operative assessment

## 2018-01-13 NOTE — Op Note (Signed)
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  Placement of Acell 1 gm and 7 x 10 cm sheet  SURGEON: Rhyse Loux Sanger Corrie Brannen, DO  ASSISTANT: Carmen Mayo, PA  EBL: 2 cc  CONDITION: Stable  COMPLICATIONS: None  INDICATION: The patient, Patrick Coleman, is a 77 y.o. male born on [birth year], is here for treatment of a squamous cell carcinoma of the scalp.   PROCEDURE DETAILS:  The patient was seen prior to surgery and marked.  The IV antibiotics were given. The patient was taken to the operating room and given a general anesthetic. A standard time out was performed and all information was confirmed by those in the room. SCDs were placed.   The area of the scalp, posterior parietal area, was prepped and draped in the usual sterile fashion. Local was injected around the area for intraoperative hemostasis and post operative pain control.  A short stitch was placed anterior, long stitch right.  The #10 blade and needle tip bovie was used to excise the lesion 6 x 6 cm total.  Hemostasis was achieved with electrocautery.  Al of the acell powder and sheet was applied and secured to the skin with the 5-0 Vicryl.  The sorbact was secured to the skin with 5-0 Vicryl.  KY gel and gauze was applied.  The specimen was sent to pathology. The patient was allowed to wake up and taken to recovery room in stable condition at the end of the case. The family was notified at the end of the case.

## 2018-01-13 NOTE — Discharge Instructions (Signed)
KY gel to the scalp wound twice a day. Don't get it wet.

## 2018-01-13 NOTE — Transfer of Care (Signed)
Immediate Anesthesia Transfer of Care Note  Patient: Patrick Coleman  Procedure(s) Performed: Excision of squamous cell carcinoma (N/A Head) APPLICATION OF A-CELL OF HEAD/NECK (N/A Head)  Patient Location: PACU  Anesthesia Type:General  Level of Consciousness: awake, alert  and oriented  Airway & Oxygen Therapy: Patient Spontanous Breathing and Patient connected to nasal cannula oxygen  Post-op Assessment: Report given to RN, Post -op Vital signs reviewed and stable and Patient moving all extremities  Post vital signs: Reviewed and stable  Last Vitals:  Vitals Value Taken Time  BP 136/80 01/13/2018  1:53 PM  Temp    Pulse 73 01/13/2018  1:56 PM  Resp 19 01/13/2018  1:56 PM  SpO2 97 % 01/13/2018  1:56 PM  Vitals shown include unvalidated device data.  Last Pain:  Vitals:   01/13/18 1123  TempSrc:   PainSc: 0-No pain         Complications: No apparent anesthesia complications

## 2018-01-13 NOTE — Anesthesia Preprocedure Evaluation (Signed)
Anesthesia Evaluation  Patient identified by MRN, date of birth, ID band Patient awake    Reviewed: Allergy & Precautions, NPO status , Patient's Chart, lab work & pertinent test results, reviewed documented beta blocker date and time   Airway Mallampati: II  TM Distance: >3 FB Neck ROM: Full    Dental  (+) Dental Advisory Given, Teeth Intact   Pulmonary neg pulmonary ROS,    Pulmonary exam normal breath sounds clear to auscultation       Cardiovascular hypertension, Pt. on home beta blockers Normal cardiovascular exam Rhythm:Regular Rate:Normal     Neuro/Psych negative neurological ROS  negative psych ROS   GI/Hepatic negative GI ROS, Neg liver ROS,   Endo/Other  diabetes, Type 2  Renal/GU negative Renal ROS     Musculoskeletal  (+) Arthritis ,   Abdominal   Peds  Hematology negative hematology ROS (+)   Anesthesia Other Findings   Reproductive/Obstetrics negative OB ROS                             Anesthesia Physical Anesthesia Plan  ASA: III  Anesthesia Plan: General   Post-op Pain Management:    Induction: Intravenous  PONV Risk Score and Plan: 2 and Dexamethasone and Ondansetron  Airway Management Planned: LMA and Oral ETT  Additional Equipment: None  Intra-op Plan:   Post-operative Plan: Extubation in OR  Informed Consent: I have reviewed the patients History and Physical, chart, labs and discussed the procedure including the risks, benefits and alternatives for the proposed anesthesia with the patient or authorized representative who has indicated his/her understanding and acceptance.   Dental advisory given  Plan Discussed with: CRNA  Anesthesia Plan Comments:         Anesthesia Quick Evaluation

## 2018-01-14 ENCOUNTER — Encounter (HOSPITAL_COMMUNITY): Payer: Self-pay | Admitting: Plastic Surgery

## 2018-01-14 NOTE — Anesthesia Postprocedure Evaluation (Signed)
Anesthesia Post Note  Patient: Patrick Coleman  Procedure(s) Performed: Excision of squamous cell carcinoma (N/A Head) APPLICATION OF A-CELL OF HEAD/NECK (N/A Head)     Patient location during evaluation: PACU Anesthesia Type: General Level of consciousness: sedated and patient cooperative Pain management: pain level controlled Vital Signs Assessment: post-procedure vital signs reviewed and stable Respiratory status: spontaneous breathing Cardiovascular status: stable Anesthetic complications: no    Last Vitals:  Vitals:   01/13/18 1425 01/13/18 1450  BP:  105/67  Pulse:    Resp:  18  Temp: 36.6 C   SpO2:  98%    Last Pain:  Vitals:   01/13/18 1450  TempSrc:   PainSc: 0-No pain                 Nolon Nations

## 2018-01-21 ENCOUNTER — Ambulatory Visit (INDEPENDENT_AMBULATORY_CARE_PROVIDER_SITE_OTHER): Payer: PPO | Admitting: Plastic Surgery

## 2018-01-21 ENCOUNTER — Encounter: Payer: Self-pay | Admitting: Plastic Surgery

## 2018-01-21 VITALS — BP 128/74 | HR 78 | Ht 70.0 in | Wt 196.0 lb

## 2018-01-21 DIAGNOSIS — C4442 Squamous cell carcinoma of skin of scalp and neck: Secondary | ICD-10-CM

## 2018-01-21 NOTE — Progress Notes (Addendum)
   Subjective:    Patient ID: Patrick Coleman, male    DOB: 1940/03/28, 77 y.o.   MRN: [id number]  The patient is a 77 year old white male here with his wife for follow-up on his squamous cell carcinoma of the scalp.  He underwent excision last week.  Overall he is doing very well.  Pain is minimal.  No sign of infection and no drainage.  The pathology showed clear peripheral margins however the deep margin is still positive.  The VAC is in place.  And they have been using K-Y jelly 1-2 times a day.   Review of Systems  Constitutional: Negative.   HENT: Negative.   Eyes: Negative.   Respiratory: Negative.   Genitourinary: Negative.        Objective:   Physical Exam  Constitutional: He appears well-developed.  HENT:  Head: Normocephalic.    Eyes: Pupils are equal, round, and reactive to light.  Pulmonary/Chest: Effort normal.  Neurological: He is alert.  Skin: There is erythema.  Psychiatric: He has a normal mood and affect.      Assessment & Plan:  Squamous cell carcinoma of scalp  After discussing the options for watching or resecting the patient and family would like to move ahead with resection for an attempt at negative margins.  I think that this is reasonable.  We will arrange for it.  Plan will be for excision of skull at site of squamous cell carcinoma.  Placement of ACell.

## 2018-01-25 DIAGNOSIS — J0101 Acute recurrent maxillary sinusitis: Secondary | ICD-10-CM | POA: Diagnosis not present

## 2018-01-25 DIAGNOSIS — Z6829 Body mass index (BMI) 29.0-29.9, adult: Secondary | ICD-10-CM | POA: Diagnosis not present

## 2018-01-25 DIAGNOSIS — G20 Parkinson's disease: Secondary | ICD-10-CM | POA: Diagnosis not present

## 2018-02-08 DIAGNOSIS — E782 Mixed hyperlipidemia: Secondary | ICD-10-CM | POA: Diagnosis not present

## 2018-02-08 DIAGNOSIS — I10 Essential (primary) hypertension: Secondary | ICD-10-CM | POA: Diagnosis not present

## 2018-02-08 DIAGNOSIS — N400 Enlarged prostate without lower urinary tract symptoms: Secondary | ICD-10-CM | POA: Diagnosis not present

## 2018-02-18 ENCOUNTER — Encounter: Payer: PPO | Admitting: Plastic Surgery

## 2018-02-18 NOTE — Pre-Procedure Instructions (Signed)
Patrick Coleman  02/18/2018      [facility] - [place], [place], [place] [address] [place] [address] Phone: [phone] Fax: [phone]    Your procedure is scheduled on Monday, December 16th.  Report to [facility] Admitting at 5:30 A.M.  Call this number if you have problems the morning of surgery:  [phone]   Remember:  Do not eat or drink after midnight.    Take these medicines the morning of surgery with A SIP OF WATER  buPROPion (WELLBUTRIN SR) carbidopa-levodopa (SINEMET IR) HYDROcodone-acetaminophen (NORCO)-as needed metoprolol tartrate (LOPRESSOR)  sertraline (ZOLOFT)   Follow your surgeon's instructions on when to stop Asprin.  If no instructions were given by your surgeon then you will need to call the office to get those instructions.    7 days prior to surgery STOP taking any Aspirin(unless otherwise instructed by your surgeon), Aleve, Naproxen, Ibuprofen, Motrin, Advil, Goody's, BC's, all herbal medications, fish oil, and all vitamins and supplements.      Do not wear jewelry.  Do not wear lotions, powders, or colognes, or deodorant.  Men may shave face and neck.  Do not bring valuables to the hospital.  [facility] is not responsible for any belongings or valuables.  Eyeglasses, hearing aids, contacts, dentures or bridgework may not be worn into surgery.  Leave your suitcase in the car.  After surgery it may be brought to your room.  For patients admitted to the hospital, discharge time will be determined by your treatment team.  Patients discharged the day of surgery will not be allowed to drive home.   Special instructions:   [facility]- Preparing For Surgery  Before surgery, you can play an important role. Because skin is not sterile, your skin needs to be as free of germs as possible. You can reduce the number of germs on your skin by washing with CHG (chlorahexidine gluconate) Soap before surgery.   CHG is an antiseptic cleaner which kills germs and bonds with the skin to continue killing germs even after washing.    Oral Hygiene is also important to reduce your risk of infection.  Remember - BRUSH YOUR TEETH THE MORNING OF SURGERY WITH YOUR REGULAR TOOTHPASTE  Please do not use if you have an allergy to CHG or antibacterial soaps. If your skin becomes reddened/irritated stop using the CHG.  Do not shave (including legs and underarms) for at least 48 hours prior to first CHG shower. It is OK to shave your face.  Please follow these instructions carefully.   1. Shower the NIGHT BEFORE SURGERY and the MORNING OF SURGERY with CHG.   2. If you chose to wash your hair, wash your hair first as usual with your normal shampoo.  3. After you shampoo, rinse your hair and body thoroughly to remove the shampoo.  4. Use CHG as you would any other liquid soap. You can apply CHG directly to the skin and wash gently with a scrungie or a clean washcloth.   5. Apply the CHG Soap to your body ONLY FROM THE NECK DOWN.  Do not use on open wounds or open sores. Avoid contact with your eyes, ears, mouth and genitals (private parts). Wash Face and genitals (private parts)  with your normal soap.  6. Wash thoroughly, paying special attention to the area where your surgery will be performed.  7. Thoroughly rinse your body with warm water from the  neck down.  8. DO NOT shower/wash with your normal soap after using and rinsing off the CHG Soap.  9. Pat yourself dry with a CLEAN TOWEL.  10. Wear CLEAN PAJAMAS to bed the night before surgery, wear comfortable clothes the morning of surgery  11. Place CLEAN SHEETS on your bed the night of your first shower and DO NOT SLEEP WITH PETS.    Day of Surgery:  Do not apply any deodorants/lotions.  Please wear clean clothes to the hospital/surgery center.   Remember to brush your teeth WITH YOUR REGULAR TOOTHPASTE.   Please read over the following fact sheets  that you were given.

## 2018-02-19 ENCOUNTER — Emergency Department (HOSPITAL_COMMUNITY): Payer: PPO

## 2018-02-19 ENCOUNTER — Encounter (HOSPITAL_COMMUNITY): Payer: Self-pay

## 2018-02-19 ENCOUNTER — Encounter (HOSPITAL_COMMUNITY)
Admission: RE | Admit: 2018-02-19 | Discharge: 2018-02-19 | Disposition: A | Discharge status: Home / Self Care | Payer: PPO | Source: Ambulatory Visit | Attending: Plastic Surgery | Admitting: Plastic Surgery

## 2018-02-19 ENCOUNTER — Other Ambulatory Visit: Payer: Self-pay

## 2018-02-19 ENCOUNTER — Encounter (HOSPITAL_COMMUNITY): Payer: Self-pay | Admitting: Emergency Medicine

## 2018-02-19 ENCOUNTER — Emergency Department (HOSPITAL_COMMUNITY)
Admission: EM | Admit: 2018-02-19 | Discharge: 2018-02-19 | Disposition: A | Discharge status: Home / Self Care | Payer: PPO | Attending: Emergency Medicine | Admitting: Emergency Medicine

## 2018-02-19 DIAGNOSIS — W19XXXA Unspecified fall, initial encounter: Secondary | ICD-10-CM | POA: Diagnosis not present

## 2018-02-19 DIAGNOSIS — Z01812 Encounter for preprocedural laboratory examination: Secondary | ICD-10-CM | POA: Insufficient documentation

## 2018-02-19 DIAGNOSIS — Y9301 Activity, walking, marching and hiking: Secondary | ICD-10-CM | POA: Diagnosis not present

## 2018-02-19 DIAGNOSIS — S0990XA Unspecified injury of head, initial encounter: Secondary | ICD-10-CM | POA: Diagnosis not present

## 2018-02-19 DIAGNOSIS — Z7982 Long term (current) use of aspirin: Secondary | ICD-10-CM | POA: Diagnosis not present

## 2018-02-19 DIAGNOSIS — Y92511 Restaurant or cafe as the place of occurrence of the external cause: Secondary | ICD-10-CM | POA: Diagnosis not present

## 2018-02-19 DIAGNOSIS — W0110XA Fall on same level from slipping, tripping and stumbling with subsequent striking against unspecified object, initial encounter: Secondary | ICD-10-CM | POA: Insufficient documentation

## 2018-02-19 DIAGNOSIS — R58 Hemorrhage, not elsewhere classified: Secondary | ICD-10-CM | POA: Diagnosis not present

## 2018-02-19 DIAGNOSIS — Z23 Encounter for immunization: Secondary | ICD-10-CM | POA: Insufficient documentation

## 2018-02-19 DIAGNOSIS — Z79899 Other long term (current) drug therapy: Secondary | ICD-10-CM | POA: Insufficient documentation

## 2018-02-19 DIAGNOSIS — S6991XA Unspecified injury of right wrist, hand and finger(s), initial encounter: Secondary | ICD-10-CM | POA: Diagnosis present

## 2018-02-19 DIAGNOSIS — I10 Essential (primary) hypertension: Secondary | ICD-10-CM | POA: Diagnosis not present

## 2018-02-19 DIAGNOSIS — S0003XA Contusion of scalp, initial encounter: Secondary | ICD-10-CM | POA: Diagnosis not present

## 2018-02-19 DIAGNOSIS — E119 Type 2 diabetes mellitus without complications: Secondary | ICD-10-CM | POA: Diagnosis not present

## 2018-02-19 DIAGNOSIS — S61216A Laceration without foreign body of right little finger without damage to nail, initial encounter: Secondary | ICD-10-CM | POA: Diagnosis not present

## 2018-02-19 DIAGNOSIS — R52 Pain, unspecified: Secondary | ICD-10-CM | POA: Diagnosis not present

## 2018-02-19 DIAGNOSIS — S62620A Displaced fracture of medial phalanx of right index finger, initial encounter for closed fracture: Secondary | ICD-10-CM | POA: Diagnosis not present

## 2018-02-19 DIAGNOSIS — S62626B Displaced fracture of medial phalanx of right little finger, initial encounter for open fracture: Secondary | ICD-10-CM

## 2018-02-19 DIAGNOSIS — R0902 Hypoxemia: Secondary | ICD-10-CM | POA: Diagnosis not present

## 2018-02-19 DIAGNOSIS — Y999 Unspecified external cause status: Secondary | ICD-10-CM | POA: Diagnosis not present

## 2018-02-19 DIAGNOSIS — S63282A Dislocation of proximal interphalangeal joint of right middle finger, initial encounter: Secondary | ICD-10-CM | POA: Diagnosis not present

## 2018-02-19 DIAGNOSIS — S199XXA Unspecified injury of neck, initial encounter: Secondary | ICD-10-CM | POA: Diagnosis not present

## 2018-02-19 LAB — CBC
HCT: 44.5 % (ref 39.0–52.0)
HCT: 44.9 % (ref 39.0–52.0)
HEMOGLOBIN: 13.9 g/dL (ref 13.0–17.0)
HEMOGLOBIN: 13.9 g/dL (ref 13.0–17.0)
MCH: 31.6 pg (ref 26.0–34.0)
MCH: 31.2 pg (ref 26.0–34.0)
MCHC: 31.2 g/dL (ref 30.0–36.0)
MCHC: 31 g/dL (ref 30.0–36.0)
MCV: 101.1 fL — AB (ref 80.0–100.0)
MCV: 100.7 fL — ABNORMAL HIGH (ref 80.0–100.0)
NRBC: 0 % (ref 0.0–0.2)
PLATELETS: 138 10*3/uL — AB (ref 150–400)
PLATELETS: 117 10*3/uL — AB (ref 150–400)
RBC: 4.4 MIL/uL (ref 4.22–5.81)
RBC: 4.46 MIL/uL (ref 4.22–5.81)
RDW: 13.4 % (ref 11.5–15.5)
RDW: 13.6 % (ref 11.5–15.5)
WBC: 5.8 10*3/uL (ref 4.0–10.5)
WBC: 6.1 10*3/uL (ref 4.0–10.5)
nRBC: 0 % (ref 0.0–0.2)

## 2018-02-19 LAB — BASIC METABOLIC PANEL
ANION GAP: 12 (ref 5–15)
Anion gap: 12 (ref 5–15)
BUN: 19 mg/dL (ref 8–23)
BUN: 19 mg/dL (ref 8–23)
CALCIUM: 9.2 mg/dL (ref 8.9–10.3)
CALCIUM: 9.3 mg/dL (ref 8.9–10.3)
CHLORIDE: 107 mmol/L (ref 98–111)
CHLORIDE: 106 mmol/L (ref 98–111)
CO2: 23 mmol/L (ref 22–32)
CO2: 22 mmol/L (ref 22–32)
CREATININE: 1.28 mg/dL — AB (ref 0.61–1.24)
CREATININE: 1.15 mg/dL (ref 0.61–1.24)
GFR calc Af Amer: >60 mL/min (ref >60)
GFR calc non Af Amer: 54 mL/min — ABNORMAL LOW (ref >60)
GFR calc non Af Amer: >60 mL/min (ref >60)
GLUCOSE: 92 mg/dL (ref 70–99)
Glucose, Bld: 102 mg/dL — ABNORMAL HIGH (ref 70–99)
POTASSIUM: 4.2 mmol/L (ref 3.5–5.1)
Potassium: 4.5 mmol/L (ref 3.5–5.1)
SODIUM: 142 mmol/L (ref 135–145)
SODIUM: 140 mmol/L (ref 135–145)

## 2018-02-19 LAB — GLUCOSE, CAPILLARY: GLUCOSE-CAPILLARY: 101 mg/dL — AB (ref 70–99)

## 2018-02-19 MED ORDER — CEPHALEXIN 500 MG PO CAPS: 500.0000 mg | ORAL_CAPSULE | Freq: Three times a day (TID) | ORAL | 0 refills | Status: DC

## 2018-02-19 MED ORDER — LIDOCAINE-EPINEPHRINE (PF) 2 %-1:200000 IJ SOLN
20.0000 mL | Freq: Once | INTRAMUSCULAR | Status: AC
  Administered 2018-02-19: 20 mL
  Filled 2018-02-19: qty 20

## 2018-02-19 MED ORDER — TETANUS-DIPHTH-ACELL PERTUSSIS 5-2.5-18.5 LF-MCG/0.5 IM SUSP
0.5000 mL | Freq: Once | INTRAMUSCULAR | Status: AC
  Administered 2018-02-19: 0.5 mL via INTRAMUSCULAR
  Filled 2018-02-19: qty 0.5

## 2018-02-19 MED ORDER — CEFAZOLIN SODIUM-DEXTROSE 2-4 GM/100ML-% IV SOLN
2.0000 g | Freq: Once | INTRAVENOUS | Status: AC
  Administered 2018-02-19: 2 g via INTRAVENOUS
  Filled 2018-02-19: qty 100

## 2018-02-19 MED ORDER — CEPHALEXIN 250 MG PO CAPS
500.0000 mg | ORAL_CAPSULE | Freq: Once | ORAL | Status: AC
  Administered 2018-02-19: 500 mg via ORAL
  Filled 2018-02-19: qty 2

## 2018-02-19 NOTE — Discharge Instructions (Addendum)
These apply Neosporin twice a day to your forehead and your nose  Fill the prescription for antibiotics for the finger laceration  Follow-up with orthopedic surgeon on Friday for repeat evaluation

## 2018-02-19 NOTE — ED Triage Notes (Signed)
Per [facility]: Patient to ED following mechanical fall - was walking outside of restaurant and tripped over the sidewalk. Deformity noted to R little finger and abrasion to forehead. No LOC, not on blood thinners. A&O x 4.

## 2018-02-19 NOTE — ED Provider Notes (Signed)
[name] Provider Note   CSN: [id number] Arrival date & time: 02/19/18  1213     History   Chief Complaint Chief Complaint  Patient presents with  . Fall    HPI Patrick Coleman is a 77 y.o. male.  HPI Patient is a 77 year old male who was walking into a restaurant when he tripped and fell prior to entering the restaurant resulting in abrasion/laceration to his forehead bridge of his nose.  He also presents with deformity to his right little finger consistent with a open fracture dislocation.  Mild headache.  Mild neck pain.  Denies weakness of his arms or legs.  No paresthesias.  Pain in his right little finger is moderate in severity and worse with palpation.  No use of anticoagulants.  Patient was in his normal state of health prior to this.   Past Medical History:  Diagnosis Date  . Anxiety   . Bipolar disorder ([name])   . Depression   . Diabetes mellitus without complication ([name])    Type II  . History of kidney stones   . Hypertension   . Osteoarthritis   . Squamous cell carcinoma of scalp     Patient Active Problem List   Diagnosis Date Noted  . Thyroid nodule 01/03/2018  . Squamous cell carcinoma of scalp 12/17/2017    Past Surgical History:  Procedure Laterality Date  . APPLICATION OF A-CELL OF HEAD/NECK N/A 01/13/2018   Procedure: APPLICATION OF A-CELL OF HEAD/NECK;  Surgeon: Wallace Going, DO;  Location: [name];  Service: Plastics;  Laterality: N/A;  . BACK SURGERY    . HERNIA REPAIR    . HIP FRACTURE SURGERY     Right  . LIPOMA EXCISION N/A 01/13/2018   Procedure: Excision of squamous cell carcinoma;  Surgeon: Wallace Going, DO;  Location: [facility];  Service: Plastics;  Laterality: N/A;  . VASECTOMY          Home Medications    Prior to Admission medications   Medication Sig Start Date End Date Taking? Authorizing Provider  aspirin EC 81 MG tablet Take 81 mg by mouth at bedtime.    [provider]  atorvastatin (LIPITOR) 80 MG tablet Take 80 mg by mouth at bedtime. 12/31/17   [provider]  buPROPion (WELLBUTRIN SR) 100 MG 12 hr tablet Take 100 mg by mouth every morning. 12/04/17   [provider]  carbidopa-levodopa (SINEMET IR) 25-100 MG tablet Take 1 tablet by mouth 3 (three) times daily. 10/25/17   [provider]  cholecalciferol (VITAMIN D) 1000 units tablet Take 2,000 Units by mouth every morning.    [provider]  doxazosin (CARDURA) 4 MG tablet Take 4 mg by mouth at bedtime. 12/04/17   [provider]  HYDROcodone-acetaminophen (NORCO) 5-325 MG tablet Take 1 tablet by mouth every 6 (six) hours as needed for moderate pain. 01/13/18   Dillingham, Loel Lofty, DO  lamoTRIgine (LAMICTAL) 150 MG tablet Take 150 mg by mouth at bedtime. 12/04/17   [provider]  lithium carbonate (ESKALITH) 450 MG CR tablet Take 450 mg by mouth at bedtime.  12/04/17   [provider]  metoprolol tartrate (LOPRESSOR) 25 MG tablet Take 25 mg by mouth every morning.  12/06/17   [provider]  Multiple Vitamins-Minerals (MULTIVITAMIN PO) Take 1 tablet by mouth every morning.    [provider]  OLANZapine (ZYPREXA) 10 MG tablet Take 15 mg by mouth at bedtime.  [provider]  sertraline (ZOLOFT) 50 MG tablet Take 50 mg by mouth every morning.  12/04/17   [provider]    Family History No family history on file.  Social History Social History   Tobacco Use  . Smoking status: Never Smoker  . Smokeless tobacco: Never Used  Substance Use Topics  . Alcohol use: Not Currently  . Drug use: Never     Allergies   Patient has no known allergies.   Review of Systems Review of Systems  All other systems reviewed and are negative.    Physical Exam Updated Vital Signs BP (!) 141/80   Pulse 62   Temp 98.2 F (36.8 C)   Resp 18   SpO2 94%   Physical Exam  Constitutional: He is  oriented to person, place, and time. He appears well-developed and well-nourished.  HENT:  Head: Normocephalic.  Abrasions over the bridge of his nose in his mid forehead without active bleeding.  No trismus or malocclusion.  Eyes: EOM are normal.  Neck: Neck supple.  Lateral cervical and paracervical tenderness without cervical step-off.  Cardiovascular: Normal rate, regular rhythm and normal heart sounds.  Pulmonary/Chest: Effort normal and breath sounds normal. No respiratory distress.  Abdominal: Soft. He exhibits no distension. There is no tenderness.  Musculoskeletal: Normal range of motion.  Open fracture dislocation of the right little finger at the level of the PIP joint with exposed middle phalanx sticking out of the skin.  Neurological: He is alert and oriented to person, place, and time.  Skin: Skin is warm and dry.  Psychiatric: He has a normal mood and affect. Judgment normal.  Nursing note and vitals reviewed.    ED Treatments / Results  Labs (all labs ordered are listed, but only abnormal results are displayed) Labs Reviewed  CBC - Abnormal; Notable for the following components:      Result Value   MCV 100.7 (*)    Platelets 117 (*)    All other components within normal limits  BASIC METABOLIC PANEL    EKG None  Radiology Ct Head Wo Contrast  Result Date: 02/19/2018 CLINICAL DATA:  Fall. Hit head on sidewalk. Initial encounter. EXAM: CT HEAD WITHOUT CONTRAST CT CERVICAL SPINE WITHOUT CONTRAST TECHNIQUE: Multidetector CT imaging of the head and cervical spine was performed following the standard protocol without intravenous contrast. Multiplanar CT image reconstructions of the cervical spine were also generated. COMPARISON:  Soft tissue neck CT 12/24/2017. Head CT 07/26/2011 FINDINGS: CT HEAD FINDINGS Brain: There is no evidence of acute infarct, intracranial hemorrhage, mass, midline shift, or extra-axial fluid collection. There is mild to moderate cerebral  atrophy. There is mild enlargement of the cisterna magna, unchanged. Vascular: Calcified atherosclerosis at the skull base. Skull: No fracture or focal osseous lesion. Sinuses/Orbits: Minimal mucosal thickening in the paranasal sinuses. Small right mastoid effusion. Unremarkable orbits. Other: Small frontal scalp hematoma. Small parietal scalp hematoma with laceration. CT CERVICAL SPINE FINDINGS Alignment: Cervical spine straightening. No listhesis. Skull base and vertebrae: No acute fracture or suspicious osseous lesion. Soft tissues and spinal canal: No prevertebral fluid or swelling. No visible canal hematoma. Disc levels: Severe disc degeneration at C4-5 greater than C6-7 with moderate degenerative changes at C5-6. Asymmetric left facet arthrosis with ankylosis at C2-3. Moderate bilateral facet arthrosis at C3-4. Mild-to-moderate multilevel osseous neural foraminal stenosis. Upper chest: Clear lung apices. Other: Partially visualized left thyroid mass measuring approximately 3.4 cm, similar to the prior neck CT. IMPRESSION: 1. No evidence of  acute intracranial abnormality. 2. Anterior and posterior scalp hematomas. 3. No evidence of acute fracture or traumatic subluxation in the cervical spine. Electronically Signed   By: Logan Bores M.D.   On: 02/19/2018 15:11   Ct Cervical Spine Wo Contrast  Result Date: 02/19/2018 CLINICAL DATA:  Fall. Hit head on sidewalk. Initial encounter. EXAM: CT HEAD WITHOUT CONTRAST CT CERVICAL SPINE WITHOUT CONTRAST TECHNIQUE: Multidetector CT imaging of the head and cervical spine was performed following the standard protocol without intravenous contrast. Multiplanar CT image reconstructions of the cervical spine were also generated. COMPARISON:  Soft tissue neck CT 12/24/2017. Head CT 07/26/2011 FINDINGS: CT HEAD FINDINGS Brain: There is no evidence of acute infarct, intracranial hemorrhage, mass, midline shift, or extra-axial fluid collection. There is mild to moderate  cerebral atrophy. There is mild enlargement of the cisterna magna, unchanged. Vascular: Calcified atherosclerosis at the skull base. Skull: No fracture or focal osseous lesion. Sinuses/Orbits: Minimal mucosal thickening in the paranasal sinuses. Small right mastoid effusion. Unremarkable orbits. Other: Small frontal scalp hematoma. Small parietal scalp hematoma with laceration. CT CERVICAL SPINE FINDINGS Alignment: Cervical spine straightening. No listhesis. Skull base and vertebrae: No acute fracture or suspicious osseous lesion. Soft tissues and spinal canal: No prevertebral fluid or swelling. No visible canal hematoma. Disc levels: Severe disc degeneration at C4-5 greater than C6-7 with moderate degenerative changes at C5-6. Asymmetric left facet arthrosis with ankylosis at C2-3. Moderate bilateral facet arthrosis at C3-4. Mild-to-moderate multilevel osseous neural foraminal stenosis. Upper chest: Clear lung apices. Other: Partially visualized left thyroid mass measuring approximately 3.4 cm, similar to the prior neck CT. IMPRESSION: 1. No evidence of acute intracranial abnormality. 2. Anterior and posterior scalp hematomas. 3. No evidence of acute fracture or traumatic subluxation in the cervical spine. Electronically Signed   By: Logan Bores M.D.   On: 02/19/2018 15:11   Dg Hand Complete Right  Result Date: 02/19/2018 CLINICAL DATA:  Pain following fall EXAM: RIGHT HAND - COMPLETE 3+ VIEW COMPARISON:  None. FINDINGS: Frontal, oblique, and lateral views were obtained. There is a bandage overlying the fifth digit. There is an apparent comminuted fracture of the proximal aspect of the fifth middle phalanx, best appreciated on the lateral view. No other fractures are identified. No dislocation. There is narrowing of all PIP and DIP joints. No erosive changes. IMPRESSION: Comminuted fracture proximal aspect of the fifth middle phalanx, best appreciated on the lateral view. Overlying bandage in this area. No  other fractures evident. No dislocation. Narrowing multiple distal joints. Electronically Signed   By: Lowella Grip III M.D.   On: 02/19/2018 13:29    Procedures Reduction of dislocation Performed by: Jola Schmidt, MD Authorized by: Jola Schmidt, MD   ..Laceration Repair Date/Time: 02/19/2018 4:21 PM Performed by: Jola Schmidt, MD Authorized by: Jola Schmidt, MD     Reduction of dislocation Performed by: Jola Schmidt Consent: Verbal consent obtained. Risks and benefits: risks, benefits and alternatives were discussed Consent given by: patient Required items: required blood products, implants, devices, and special equipment available Time out: Immediately prior to procedure a "time out" was called to verify the correct patient, procedure, equipment, support staff and site/side marked as required. Patient sedated: no Vitals: Vital signs were monitored during sedation. Patient tolerance: Patient tolerated the procedure well with no immediate complications. Joint: right middle finger PIP joint Reduction technique: manipulation OPEN FRACTURE DISLOCATION   LACERATION REPAIR Performed by: Jola Schmidt Consent: Verbal consent obtained. Risks and benefits: risks, benefits and alternatives were discussed Patient  identity confirmed: provided demographic data Time out performed prior to procedure Prepped and Draped in normal sterile fashion Wound explored Laceration Location: Right little finger Laceration Length: 1.5 cm No Foreign Bodies seen or palpated Anesthesia: local infiltration Local anesthetic: lidocaine 2 % with epinephrine Anesthetic total: 4 ml Irrigation method: syringe Amount of cleaning: standard Skin closure: 3-0 Prolene Number of sutures or staples: 2 Technique: Simple interrupted (loose) Patient tolerance: Patient tolerated the procedure well with no immediate complications.   Medications Ordered in ED Medications  ceFAZolin (ANCEF) IVPB 2g/100 mL  premix (2 g Intravenous New Bag/Given 02/19/18 1307)  Tdap (BOOSTRIX) injection 0.5 mL (0.5 mLs Intramuscular Given 02/19/18 1305)     Initial Impression / Assessment and Plan / ED Course  I have reviewed the triage vital signs and the nursing notes.  Pertinent labs & imaging results that were available during my care of the patient were reviewed by me and considered in my medical decision making (see chart for details).     Open fracture dislocation of the right little finger.  Reduced at the bedside.  Sterile dressing placed.  Ancef and Tdap.  Orthopedic hand consultation with Silvestre Gunner, PA-C.  CT head and C-spine pending at this time.  Mechanical fall.  Baseline labs.  N.p.o.  Case discussed with orthopedic team who recommends washing the wound out at the bedside closure and patient will be seen by the orthopedic hand surgeon, Dr. Apolonio Schneiders, on Friday in clinic.  CT imaging of the head without acute intracranial abnormality.  Local wound care recommendations for abrasions over the nose and forehead.  Home on Keflex.  Infection warnings given.  Head injury warnings given.  Patient be discharged home in good condition with spouse.  Full range of motion of bilateral upper and lower extremity major joints.   Final Clinical Impressions(s) / ED Diagnoses   Final diagnoses:  Closed head injury, initial encounter  Open displaced fracture of middle phalanx of right little finger, initial encounter  Fall, initial encounter    ED Discharge Orders         Ordered    cephALEXin (KEFLEX) 500 MG capsule  3 times daily     02/19/18 1618           Jola Schmidt, MD 02/19/18 1623

## 2018-02-19 NOTE — Progress Notes (Signed)
PCP - Dr. Consuello Masse  Cardiologist - Denies  Chest x-ray - Denies  EKG - 01/08/18 (E)  Stress Test - Denies  ECHO - Denies  Cardiac Cath - Denies  AICD- na PM- na LOOP- na  Sleep Study - Denies CPAP - None  LABS- 02/19/18: CBC, BMP  ASA-LD- 12/2  HA1C- 01/08/18: 4.8 (E) Fasting Blood Sugar - today, 101 Checks Blood Sugar __0___ times a day- Pt controls with diet, no meter.  Anesthesia- No  Pt denies having chest pain, sob, or fever at this time. All instructions explained to the pt, with a verbal understanding of the material. Pt agrees to go over the instructions while at home for a better understanding. The opportunity to ask questions was provided.

## 2018-02-19 NOTE — ED Notes (Signed)
MD at bedside. 

## 2018-02-19 NOTE — ED Notes (Signed)
Patient transported to xray/CT. 

## 2018-02-19 NOTE — Consult Note (Signed)
Reason for Consult:Little finger fx Referring Physician: Raliegh Ip Patrick Coleman is an 77 y.o. male.  HPI: Patrick Coleman was walking down the sidewalk and tripped over some sort of mat. He put out his right hand to break his fall and dislocated his little finger. He came to the ED for evaluation. The EDP reduced the finger and x-rays showed a middle phalanx fx associated with the open dislocation and hand surgery was consulted. He is RHD.  Past Medical History:  Diagnosis Date  . Anxiety   . Bipolar disorder ([place])   . Depression   . Diabetes mellitus without complication ([place])    Type II  . History of kidney stones   . Hypertension   . Osteoarthritis   . Squamous cell carcinoma of scalp     Past Surgical History:  Procedure Laterality Date  . APPLICATION OF A-CELL OF HEAD/NECK N/A 01/13/2018   Procedure: APPLICATION OF A-CELL OF HEAD/NECK;  Surgeon: Wallace Going, DO;  Location: [facility];  Service: Plastics;  Laterality: N/A;  . BACK SURGERY    . HERNIA REPAIR    . HIP FRACTURE SURGERY     Right  . LIPOMA EXCISION N/A 01/13/2018   Procedure: Excision of squamous cell carcinoma;  Surgeon: Wallace Going, DO;  Location: [facility];  Service: Plastics;  Laterality: N/A;  . VASECTOMY      No family history on file.  Social History:  reports that he has never smoked. He has never used smokeless tobacco. He reports that he drank alcohol. He reports that he does not use drugs.  Allergies: No Known Allergies  Medications: I have reviewed the patient's current medications.  Results for orders placed or performed during the hospital encounter of 02/19/18 (from the past 48 hour(s))  CBC     Status: Abnormal   Collection Time: 02/19/18  1:04 PM  Result Value Ref Range   WBC 6.1 4.0 - 10.5 K/uL   RBC 4.46 4.22 - 5.81 MIL/uL   Hemoglobin 13.9 13.0 - 17.0 g/dL   HCT 44.9 39.0 - 52.0 %   MCV 100.7 (H) 80.0 - 100.0 fL   MCH 31.2 26.0 - 34.0 pg   MCHC 31.0 30.0 - 36.0 g/dL   RDW 13.6 11.5  - 15.5 %   Platelets 117 (L) 150 - 400 K/uL   nRBC 0.0 0.0 - 0.2 %    Comment: Performed at [facility], [place] [address], [place], [place] [address]  Basic metabolic panel     Status: None   Collection Time: 02/19/18  1:04 PM  Result Value Ref Range   Sodium 140 135 - 145 mmol/L   Potassium 4.2 3.5 - 5.1 mmol/L   Chloride 106 98 - 111 mmol/L   CO2 22 22 - 32 mmol/L   Glucose, Bld 92 70 - 99 mg/dL   BUN 19 8 - 23 mg/dL   Creatinine, Ser 1.15 0.61 - 1.24 mg/dL   Calcium 9.3 8.9 - 10.3 mg/dL   GFR calc non Af Amer >60 >60 mL/min   GFR calc Af Amer >60 >60 mL/min   Anion gap 12 5 - 15    Comment: Performed at [facility] [address] [place], [place] [address]    Dg Hand Complete Right  Result Date: 02/19/2018 CLINICAL DATA:  Pain following fall EXAM: RIGHT HAND - COMPLETE 3+ VIEW COMPARISON:  None. FINDINGS: Frontal, oblique, and lateral views were obtained. There is a bandage overlying  the fifth digit. There is an apparent comminuted fracture of the proximal aspect of the fifth middle phalanx, best appreciated on the lateral view. No other fractures are identified. No dislocation. There is narrowing of all PIP and DIP joints. No erosive changes. IMPRESSION: Comminuted fracture proximal aspect of the fifth middle phalanx, best appreciated on the lateral view. Overlying bandage in this area. No other fractures evident. No dislocation. Narrowing multiple distal joints. Electronically Signed   By: Lowella Grip III M.D.   On: 02/19/2018 13:29    Review of Systems  Constitutional: Negative for weight loss.  HENT: Negative for ear discharge, ear pain, hearing loss and tinnitus.   Eyes: Negative for blurred vision, double vision, photophobia and pain.  Respiratory: Negative for cough, sputum production and shortness of breath.   Cardiovascular: Negative for chest pain.  Gastrointestinal: Negative for abdominal pain, nausea and vomiting.  Genitourinary: Negative for  dysuria, flank pain, frequency and urgency.  Musculoskeletal: Positive for joint pain (Right little finger). Negative for back pain, falls, myalgias and neck pain.  Neurological: Negative for dizziness, tingling, sensory change, focal weakness, loss of consciousness and headaches.  Endo/Heme/Allergies: Does not bruise/bleed easily.  Psychiatric/Behavioral: Negative for depression, memory loss and substance abuse. The patient is not nervous/anxious.    Blood pressure (!) 141/80, pulse 62, temperature 98.2 F (36.8 C), resp. rate 18, SpO2 94 %. Physical Exam  Constitutional: He appears well-developed and well-nourished. No distress.  HENT:  Head: Normocephalic and atraumatic.  Eyes: Conjunctivae are normal. Right eye exhibits no discharge. Left eye exhibits no discharge. No scleral icterus.  Neck: Normal range of motion.  Cardiovascular: Normal rate and regular rhythm.  Respiratory: Effort normal. No respiratory distress.  Musculoskeletal:  Right shoulder, elbow, wrist, digits- volar wound at little finger PIP joint, mod TTP, no instability, no blocks to motion  Sens  Ax/R/M/U intact  Mot   Ax/ R/ PIN/ M/ AIN/ U intact  Rad 2+  Neurological: He is alert.  Skin: Skin is warm and dry. He is not diaphoretic.  Psychiatric: He has a normal mood and affect. His behavior is normal.    Assessment/Plan: Right little finger PIP open dislocation with open middle phalanx fx -- I&D in ED, splint, and f/u with Dr. Caralyn Guile in the office on Friday. Keflex for discharge.    Lisette Abu, PA-C Orthopedic Surgery [phone] 02/19/2018, 2:16 PM

## 2018-02-19 NOTE — ED Notes (Signed)
Pt and wife verbalized understanding of discharge paperwork, prescriptions and follow-up care.

## 2018-02-21 ENCOUNTER — Encounter: Payer: PPO | Admitting: Plastic Surgery

## 2018-02-21 ENCOUNTER — Ambulatory Visit (INDEPENDENT_AMBULATORY_CARE_PROVIDER_SITE_OTHER): Payer: Self-pay | Admitting: Plastic Surgery

## 2018-02-21 ENCOUNTER — Encounter: Payer: Self-pay | Admitting: Plastic Surgery

## 2018-02-21 VITALS — BP 104/58 | HR 80 | Ht 70.0 in | Wt 195.0 lb

## 2018-02-21 DIAGNOSIS — C4442 Squamous cell carcinoma of skin of scalp and neck: Secondary | ICD-10-CM

## 2018-02-21 NOTE — Progress Notes (Signed)
Patient ID: Patrick Coleman, male    DOB: 1940/04/15, 77 y.o.   MRN: [id number]   Chief Complaint  Patient presents with  . Pre-op Exam    for squamous cell carcinoma on the top of scalp    Patrick Coleman is a 77 year old gentleman here with family for his preoperative history and physical.  He underwent excision of a squamous cell carcinoma last month.  The pathology was clear around the borders but the margin was positive.  The periosteum had been removed so he will need the outer table excised.  ACell was placed to help with healing in the meantime and he has laid down an excellent amount of healthy granulation tissue.  There is no bone exposed.   Review of Systems  Constitutional: Negative.   HENT: Negative.   Eyes: Negative.   Respiratory: Negative.   Cardiovascular: Negative.   Endocrine: Negative.   Genitourinary: Negative.   Musculoskeletal: Negative.   Skin: Positive for wound.  Neurological: Negative.   Hematological: Negative.     Past Medical History:  Diagnosis Date  . Anxiety   . Bipolar disorder ([place])   . Depression   . Diabetes mellitus without complication ([place])    Type II  . History of kidney stones   . Hypertension   . Osteoarthritis   . Squamous cell carcinoma of scalp     Past Surgical History:  Procedure Laterality Date  . APPLICATION OF A-CELL OF HEAD/NECK N/A 01/13/2018   Procedure: APPLICATION OF A-CELL OF HEAD/NECK;  Surgeon: Wallace Going, DO;  Location: [facility];  Service: Plastics;  Laterality: N/A;  . BACK SURGERY    . HERNIA REPAIR    . HIP FRACTURE SURGERY     Right  . LIPOMA EXCISION N/A 01/13/2018   Procedure: Excision of squamous cell carcinoma;  Surgeon: Wallace Going, DO;  Location: [facility];  Service: Plastics;  Laterality: N/A;  . VASECTOMY        Current Outpatient Medications:  .  aspirin EC 81 MG tablet, Take 81 mg by mouth at bedtime., Disp: , Rfl:  .  atorvastatin (LIPITOR) 80 MG tablet, Take 80 mg by mouth at  bedtime., Disp: , Rfl:  .  buPROPion (WELLBUTRIN SR) 100 MG 12 hr tablet, Take 100 mg by mouth every morning., Disp: , Rfl: 4 .  carbidopa-levodopa (SINEMET IR) 25-100 MG tablet, Take 1 tablet by mouth 3 (three) times daily., Disp: , Rfl: 6 .  cephALEXin (KEFLEX) 500 MG capsule, Take 1 capsule (500 mg total) by mouth 3 (three) times daily., Disp: 21 capsule, Rfl: 0 .  cholecalciferol (VITAMIN D) 1000 units tablet, Take 2,000 Units by mouth every morning., Disp: , Rfl:  .  doxazosin (CARDURA) 4 MG tablet, Take 4 mg by mouth at bedtime., Disp: , Rfl: 4 .  HYDROcodone-acetaminophen (NORCO) 5-325 MG tablet, Take 1 tablet by mouth every 6 (six) hours as needed for moderate pain., Disp: 30 tablet, Rfl: 0 .  lamoTRIgine (LAMICTAL) 150 MG tablet, Take 150 mg by mouth at bedtime., Disp: , Rfl: 3 .  lithium carbonate (ESKALITH) 450 MG CR tablet, Take 450 mg by mouth at bedtime. , Disp: , Rfl: 4 .  metoprolol tartrate (LOPRESSOR) 25 MG tablet, Take 25 mg by mouth every morning. , Disp: , Rfl: 7 .  Multiple Vitamins-Minerals (MULTIVITAMIN PO), Take 1 tablet by mouth every morning., Disp: , Rfl:  .  OLANZapine (ZYPREXA) 10 MG tablet, Take 15 mg by mouth  at bedtime. , Disp: , Rfl: 3 .  sertraline (ZOLOFT) 50 MG tablet, Take 50 mg by mouth every morning. , Disp: , Rfl: 3   Objective:   Vitals:   02/21/18 1449  BP: (!) 104/58  Pulse: 80  SpO2: 95%    Physical Exam Vitals signs and nursing note reviewed.  HENT:     Head:   Eyes:     Pupils: Pupils are equal, round, and reactive to light.  Cardiovascular:     Rate and Rhythm: Normal rate.  Skin:    General: Skin is warm.  Neurological:     Mental Status: He is alert and oriented to person, place, and time.  Psychiatric:        Mood and Affect: Mood normal.        Thought Content: Thought content normal.     Assessment & Plan:  Squamous cell carcinoma of scalp  Plan for excision of squamous cell carcinoma of the scalp with placement of  acell.  The risks that can be encountered with and after excision of a skin lesion were discussed and include the following but not limited to these: bleeding, infection, delayed healing, anesthesia risks, skin sensation changes, injury to structures including nerves, blood vessels, and muscles which may be temporary or permanent, allergies to tape, suture materials and glues, blood products, topical preparations or injected agents, skin contour irregularities, skin discoloration and swelling, deep vein thrombosis, cardiac and pulmonary complications, pain, which may persist, persistent pain, recurrence of the lesion, poor healing of the incision, possible need for revisional surgery or staged procedures.   [place], DO

## 2018-02-21 NOTE — H&P (View-Only) (Signed)
Patient ID: Patrick Coleman, male    DOB: 08-22-1940, 77 y.o.   MRN: [id number]   Chief Complaint  Patient presents with  . Pre-op Exam    for squamous cell carcinoma on the top of scalp    Mr. Patrick Coleman is a 77 year old gentleman here with family for his preoperative history and physical.  He underwent excision of a squamous cell carcinoma last month.  The pathology was clear around the borders but the margin was positive.  The periosteum had been removed so he will need the outer table excised.  ACell was placed to help with healing in the meantime and he has laid down an excellent amount of healthy granulation tissue.  There is no bone exposed.   Review of Systems  Constitutional: Negative.   HENT: Negative.   Eyes: Negative.   Respiratory: Negative.   Cardiovascular: Negative.   Endocrine: Negative.   Genitourinary: Negative.   Musculoskeletal: Negative.   Skin: Positive for wound.  Neurological: Negative.   Hematological: Negative.     Past Medical History:  Diagnosis Date  . Anxiety   . Bipolar disorder (Patrick Coleman)   . Depression   . Diabetes mellitus without complication (Patrick Coleman)    Type II  . History of kidney stones   . Hypertension   . Osteoarthritis   . Squamous cell carcinoma of scalp     Past Surgical History:  Procedure Laterality Date  . APPLICATION OF A-CELL OF HEAD/NECK N/A 01/13/2018   Procedure: APPLICATION OF A-CELL OF HEAD/NECK;  Surgeon: Patrick Going, DO;  Location: [facility];  Service: Plastics;  Laterality: N/A;  . BACK SURGERY    . HERNIA REPAIR    . HIP FRACTURE SURGERY     Right  . LIPOMA EXCISION N/A 01/13/2018   Procedure: Excision of squamous cell carcinoma;  Surgeon: Patrick Going, DO;  Location: [facility];  Service: Plastics;  Laterality: N/A;  . VASECTOMY        Current Outpatient Medications:  .  aspirin EC 81 MG tablet, Take 81 mg by mouth at bedtime., Disp: , Rfl:  .  atorvastatin (LIPITOR) 80 MG tablet, Take 80 mg by mouth at  bedtime., Disp: , Rfl:  .  buPROPion (WELLBUTRIN SR) 100 MG 12 hr tablet, Take 100 mg by mouth every morning., Disp: , Rfl: 4 .  carbidopa-levodopa (SINEMET IR) 25-100 MG tablet, Take 1 tablet by mouth 3 (three) times daily., Disp: , Rfl: 6 .  cephALEXin (KEFLEX) 500 MG capsule, Take 1 capsule (500 mg total) by mouth 3 (three) times daily., Disp: 21 capsule, Rfl: 0 .  cholecalciferol (VITAMIN D) 1000 units tablet, Take 2,000 Units by mouth every morning., Disp: , Rfl:  .  doxazosin (CARDURA) 4 MG tablet, Take 4 mg by mouth at bedtime., Disp: , Rfl: 4 .  HYDROcodone-acetaminophen (NORCO) 5-325 MG tablet, Take 1 tablet by mouth every 6 (six) hours as needed for moderate pain., Disp: 30 tablet, Rfl: 0 .  lamoTRIgine (LAMICTAL) 150 MG tablet, Take 150 mg by mouth at bedtime., Disp: , Rfl: 3 .  lithium carbonate (ESKALITH) 450 MG CR tablet, Take 450 mg by mouth at bedtime. , Disp: , Rfl: 4 .  metoprolol tartrate (LOPRESSOR) 25 MG tablet, Take 25 mg by mouth every morning. , Disp: , Rfl: 7 .  Multiple Vitamins-Minerals (MULTIVITAMIN PO), Take 1 tablet by mouth every morning., Disp: , Rfl:  .  OLANZapine (ZYPREXA) 10 MG tablet, Take 15 mg by mouth  at bedtime. , Disp: , Rfl: 3 .  sertraline (ZOLOFT) 50 MG tablet, Take 50 mg by mouth every morning. , Disp: , Rfl: 3   Objective:   Vitals:   02/21/18 1449  BP: (!) 104/58  Pulse: 80  SpO2: 95%    Physical Exam Vitals signs and nursing note reviewed.  HENT:     Head:   Eyes:     Pupils: Pupils are equal, round, and reactive to light.  Cardiovascular:     Rate and Rhythm: Normal rate.  Skin:    General: Skin is warm.  Neurological:     Mental Status: He is alert and oriented to person, place, and time.  Psychiatric:        Mood and Affect: Mood normal.        Thought Content: Thought content normal.     Assessment & Plan:  Squamous cell carcinoma of scalp  Plan for excision of squamous cell carcinoma of the scalp with placement of  acell.  The risks that can be encountered with and after excision of a skin lesion were discussed and include the following but not limited to these: bleeding, infection, delayed healing, anesthesia risks, skin sensation changes, injury to structures including nerves, blood vessels, and muscles which may be temporary or permanent, allergies to tape, suture materials and glues, blood products, topical preparations or injected agents, skin contour irregularities, skin discoloration and swelling, deep vein thrombosis, cardiac and pulmonary complications, pain, which may persist, persistent pain, recurrence of the lesion, poor healing of the incision, possible need for revisional surgery or staged procedures.   [place], DO

## 2018-02-23 ENCOUNTER — Encounter (HOSPITAL_COMMUNITY): Payer: Self-pay | Admitting: Anesthesiology

## 2018-02-23 NOTE — Anesthesia Preprocedure Evaluation (Addendum)
Anesthesia Evaluation  Patient identified by MRN, date of birth, ID band Patient awake    Reviewed: Allergy & Precautions, NPO status , Patient's Chart, lab work & pertinent test results, reviewed documented beta blocker date and time   Airway Mallampati: II  TM Distance: >3 FB Neck ROM: Full    Dental no notable dental hx. (+) Teeth Intact, Caps   Pulmonary neg pulmonary ROS,    Pulmonary exam normal breath sounds clear to auscultation       Cardiovascular hypertension, Pt. on medications and Pt. on home beta blockers Normal cardiovascular exam Rhythm:Regular Rate:Normal  EKG- NSR, minimal voltage criteria for LVH   Neuro/Psych PSYCHIATRIC DISORDERS Anxiety Depression Bipolar Disorder negative neurological ROS     GI/Hepatic negative GI ROS, Neg liver ROS,   Endo/Other  diabetes, Well Controlled, Type 2, Oral Hypoglycemic AgentsHyperlipidemia  Renal/GU Renal diseaseHx/o renal calculi  negative genitourinary   Musculoskeletal  (+) Arthritis , Osteoarthritis,  Squamous cell Ca scalp   Abdominal (+) - obese,   Peds  Hematology negative hematology ROS (+)   Anesthesia Other Findings   Reproductive/Obstetrics                           Anesthesia Physical Anesthesia Plan  ASA: III  Anesthesia Plan: General   Post-op Pain Management:    Induction: Intravenous  PONV Risk Score and Plan: 3 and Ondansetron, Dexamethasone and Treatment may vary due to age or medical condition  Airway Management Planned: Oral ETT  Additional Equipment:   Intra-op Plan:   Post-operative Plan: Extubation in OR  Informed Consent: I have reviewed the patients History and Physical, chart, labs and discussed the procedure including the risks, benefits and alternatives for the proposed anesthesia with the patient or authorized representative who has indicated his/her understanding and acceptance.   Dental  advisory given  Plan Discussed with: CRNA and Anesthesiologist  Anesthesia Plan Comments:        Anesthesia Quick Evaluation

## 2018-02-24 ENCOUNTER — Encounter (HOSPITAL_COMMUNITY): Payer: Self-pay

## 2018-02-24 ENCOUNTER — Other Ambulatory Visit: Payer: Self-pay

## 2018-02-24 ENCOUNTER — Ambulatory Visit (HOSPITAL_COMMUNITY): Payer: PPO | Admitting: Anesthesiology

## 2018-02-24 ENCOUNTER — Ambulatory Visit (HOSPITAL_COMMUNITY)
Admission: RE | Admit: 2018-02-24 | Discharge: 2018-02-24 | Disposition: A | Discharge status: Home / Self Care | Payer: PPO | Attending: Plastic Surgery | Admitting: Plastic Surgery

## 2018-02-24 ENCOUNTER — Encounter (HOSPITAL_COMMUNITY)
Admission: RE | Disposition: A | Discharge status: Home / Self Care | Payer: Self-pay | Source: Home / Self Care | Attending: Plastic Surgery

## 2018-02-24 DIAGNOSIS — M1990 Unspecified osteoarthritis, unspecified site: Secondary | ICD-10-CM | POA: Insufficient documentation

## 2018-02-24 DIAGNOSIS — C4442 Squamous cell carcinoma of skin of scalp and neck: Secondary | ICD-10-CM | POA: Diagnosis not present

## 2018-02-24 DIAGNOSIS — Z85828 Personal history of other malignant neoplasm of skin: Secondary | ICD-10-CM | POA: Insufficient documentation

## 2018-02-24 DIAGNOSIS — F319 Bipolar disorder, unspecified: Secondary | ICD-10-CM | POA: Diagnosis not present

## 2018-02-24 DIAGNOSIS — Z7982 Long term (current) use of aspirin: Secondary | ICD-10-CM | POA: Diagnosis not present

## 2018-02-24 DIAGNOSIS — C410 Malignant neoplasm of bones of skull and face: Secondary | ICD-10-CM | POA: Diagnosis not present

## 2018-02-24 DIAGNOSIS — I10 Essential (primary) hypertension: Secondary | ICD-10-CM | POA: Diagnosis not present

## 2018-02-24 DIAGNOSIS — C7951 Secondary malignant neoplasm of bone: Secondary | ICD-10-CM | POA: Insufficient documentation

## 2018-02-24 DIAGNOSIS — Z79899 Other long term (current) drug therapy: Secondary | ICD-10-CM | POA: Insufficient documentation

## 2018-02-24 DIAGNOSIS — E119 Type 2 diabetes mellitus without complications: Secondary | ICD-10-CM | POA: Insufficient documentation

## 2018-02-24 DIAGNOSIS — F419 Anxiety disorder, unspecified: Secondary | ICD-10-CM | POA: Insufficient documentation

## 2018-02-24 HISTORY — PX: APPLICATION OF A-CELL OF HEAD/NECK: SHX6304

## 2018-02-24 HISTORY — PX: LESION REMOVAL: SHX5196

## 2018-02-24 LAB — GLUCOSE, CAPILLARY
Glucose-Capillary: 89 mg/dL (ref 70–99)
Glucose-Capillary: 101 mg/dL — ABNORMAL HIGH (ref 70–99)

## 2018-02-24 SURGERY — MINOR EXCISION OF LESION
Anesthesia: General

## 2018-02-24 MED ORDER — FENTANYL CITRATE (PF) 250 MCG/5ML IJ SOLN
INTRAMUSCULAR | Status: DC | PRN
  Administered 2018-02-24: 50 ug via INTRAVENOUS
  Administered 2018-02-24: 25 ug via INTRAVENOUS

## 2018-02-24 MED ORDER — ONDANSETRON HCL 4 MG/2ML IJ SOLN
INTRAMUSCULAR | Status: DC | PRN
  Administered 2018-02-24: 4 mg via INTRAVENOUS

## 2018-02-24 MED ORDER — SODIUM CHLORIDE 0.9 % IV SOLN: 250.0000 mL | INTRAVENOUS | Status: DC | PRN

## 2018-02-24 MED ORDER — FENTANYL CITRATE (PF) 100 MCG/2ML IJ SOLN: 25.0000 ug | INTRAMUSCULAR | Status: DC | PRN

## 2018-02-24 MED ORDER — ACETAMINOPHEN 650 MG RE SUPP: 650.0000 mg | RECTAL | Status: DC | PRN

## 2018-02-24 MED ORDER — CEFAZOLIN SODIUM-DEXTROSE 2-4 GM/100ML-% IV SOLN
INTRAVENOUS | Status: AC
  Filled 2018-02-24: qty 100

## 2018-02-24 MED ORDER — ONDANSETRON HCL 4 MG/2ML IJ SOLN: 4.0000 mg | Freq: Once | INTRAMUSCULAR | Status: DC | PRN

## 2018-02-24 MED ORDER — LACTATED RINGERS IV SOLN
INTRAVENOUS | Status: DC | PRN
  Administered 2018-02-24: 07:00:00 via INTRAVENOUS

## 2018-02-24 MED ORDER — OXYCODONE HCL 5 MG PO TABS: 5.0000 mg | ORAL_TABLET | Freq: Once | ORAL | Status: DC | PRN

## 2018-02-24 MED ORDER — BUPIVACAINE-EPINEPHRINE 0.25% -1:200000 IJ SOLN
INTRAMUSCULAR | Status: DC | PRN
  Administered 2018-02-24: 10 mL

## 2018-02-24 MED ORDER — DEXAMETHASONE SODIUM PHOSPHATE 10 MG/ML IJ SOLN
INTRAMUSCULAR | Status: DC | PRN
  Administered 2018-02-24: 5 mg via INTRAVENOUS

## 2018-02-24 MED ORDER — MEPERIDINE HCL 50 MG/ML IJ SOLN: 6.2500 mg | INTRAMUSCULAR | Status: DC | PRN

## 2018-02-24 MED ORDER — OXYCODONE HCL 5 MG/5ML PO SOLN: 5.0000 mg | Freq: Once | ORAL | Status: DC | PRN

## 2018-02-24 MED ORDER — FENTANYL CITRATE (PF) 250 MCG/5ML IJ SOLN
INTRAMUSCULAR | Status: AC
  Filled 2018-02-24: qty 5

## 2018-02-24 MED ORDER — SODIUM CHLORIDE 0.9% FLUSH: 3.0000 mL | INTRAVENOUS | Status: DC | PRN

## 2018-02-24 MED ORDER — SODIUM CHLORIDE 0.9% FLUSH: 3.0000 mL | Freq: Two times a day (BID) | INTRAVENOUS | Status: DC

## 2018-02-24 MED ORDER — PROPOFOL 10 MG/ML IV BOLUS
INTRAVENOUS | Status: AC
  Filled 2018-02-24: qty 20

## 2018-02-24 MED ORDER — PROPOFOL 10 MG/ML IV BOLUS
INTRAVENOUS | Status: DC | PRN
  Administered 2018-02-24: 150 mg via INTRAVENOUS

## 2018-02-24 MED ORDER — CEFAZOLIN SODIUM-DEXTROSE 2-4 GM/100ML-% IV SOLN
2.0000 g | INTRAVENOUS | Status: AC
  Administered 2018-02-24: 2 g via INTRAVENOUS

## 2018-02-24 MED ORDER — OXYCODONE HCL 5 MG PO TABS: 5.0000 mg | ORAL_TABLET | ORAL | Status: DC | PRN

## 2018-02-24 MED ORDER — LIDOCAINE-EPINEPHRINE 1 %-1:100000 IJ SOLN
INTRAMUSCULAR | Status: AC
  Filled 2018-02-24: qty 1

## 2018-02-24 MED ORDER — BACITRACIN ZINC 500 UNIT/GM EX OINT
TOPICAL_OINTMENT | CUTANEOUS | Status: AC
  Filled 2018-02-24: qty 28.35

## 2018-02-24 MED ORDER — PHENYLEPHRINE 40 MCG/ML (10ML) SYRINGE FOR IV PUSH (FOR BLOOD PRESSURE SUPPORT)
PREFILLED_SYRINGE | INTRAVENOUS | Status: DC | PRN
  Administered 2018-02-24 (×2): 80 ug via INTRAVENOUS

## 2018-02-24 MED ORDER — LIDOCAINE 2% (20 MG/ML) 5 ML SYRINGE
INTRAMUSCULAR | Status: DC | PRN
  Administered 2018-02-24: 80 mg via INTRAVENOUS

## 2018-02-24 MED ORDER — BUPIVACAINE-EPINEPHRINE (PF) 0.25% -1:200000 IJ SOLN
INTRAMUSCULAR | Status: AC
  Filled 2018-02-24: qty 30

## 2018-02-24 MED ORDER — EPHEDRINE SULFATE-NACL 50-0.9 MG/10ML-% IV SOSY
PREFILLED_SYRINGE | INTRAVENOUS | Status: DC | PRN
  Administered 2018-02-24: 15 mg via INTRAVENOUS
  Administered 2018-02-24 (×2): 10 mg via INTRAVENOUS
  Administered 2018-02-24: 15 mg via INTRAVENOUS

## 2018-02-24 MED ORDER — ACETAMINOPHEN 325 MG PO TABS: 650.0000 mg | ORAL_TABLET | ORAL | Status: DC | PRN

## 2018-02-24 SURGICAL SUPPLY — 55 items
AIRSTRIP 4 3/4X3 1/4 7185 (GAUZE/BANDAGES/DRESSINGS) IMPLANT
BLADE 10 SAFETY STRL DISP (BLADE) ×3 IMPLANT
BNDG CONFORM 2 STRL LF (GAUZE/BANDAGES/DRESSINGS) IMPLANT
BNDG GAUZE ELAST 4 BULKY (GAUZE/BANDAGES/DRESSINGS) ×9 IMPLANT
CANISTER SUCT 3000ML PPV (MISCELLANEOUS) ×3 IMPLANT
CATH ROBINSON RED A/P 16FR (CATHETERS) IMPLANT
CLEANER TIP ELECTROSURG 2X2 (MISCELLANEOUS) ×3 IMPLANT
CONT SPEC 4OZ CLIKSEAL STRL BL (MISCELLANEOUS) IMPLANT
COVER SURGICAL LIGHT HANDLE (MISCELLANEOUS) ×3 IMPLANT
COVER WAND RF STERILE (DRAPES) IMPLANT
DRAIN PENROSE 1/4X12 LTX STRL (WOUND CARE) IMPLANT
DRSG CUTIMED SORBACT 7X9 (GAUZE/BANDAGES/DRESSINGS) ×3 IMPLANT
DRSG EMULSION OIL 3X3 NADH (GAUZE/BANDAGES/DRESSINGS) IMPLANT
DRSG PAD ABDOMINAL 8X10 ST (GAUZE/BANDAGES/DRESSINGS) ×6 IMPLANT
ELECT COATED BLADE 2.86 ST (ELECTRODE) ×3 IMPLANT
ELECT NEEDLE TIP 2.8 STRL (NEEDLE) IMPLANT
ELECT REM PT RETURN 9FT ADLT (ELECTROSURGICAL) ×3
ELECTRODE REM PT RTRN 9FT ADLT (ELECTROSURGICAL) ×1 IMPLANT
GAUZE 4X4 16PLY RFD (DISPOSABLE) IMPLANT
GAUZE SPONGE 4X4 12PLY STRL (GAUZE/BANDAGES/DRESSINGS) IMPLANT
GLOVE BIO SURGEON STRL SZ 6.5 (GLOVE) ×4 IMPLANT
GLOVE BIO SURGEONS STRL SZ 6.5 (GLOVE) ×2
GLOVE BIOGEL PI IND STRL 7.0 (GLOVE) ×1 IMPLANT
GLOVE BIOGEL PI INDICATOR 7.0 (GLOVE) ×2
GLOVE SURG SS PI 6.5 STRL IVOR (GLOVE) ×3 IMPLANT
GLOVE SURG SS PI 7.0 STRL IVOR (GLOVE) ×3 IMPLANT
GOWN STRL REUS W/ TWL LRG LVL3 (GOWN DISPOSABLE) ×3 IMPLANT
GOWN STRL REUS W/TWL LRG LVL3 (GOWN DISPOSABLE) ×6
KIT BASIN OR (CUSTOM PROCEDURE TRAY) ×3 IMPLANT
KIT TURNOVER KIT B (KITS) ×3 IMPLANT
MATRIX WOUND 3-LAYER 5X5 (Tissue) ×2 IMPLANT
MICROMATRIX 1000MG (Tissue) ×3 IMPLANT
NEEDLE 25GX 5/8IN NON SAFETY (NEEDLE) ×3 IMPLANT
NS IRRIG 1000ML POUR BTL (IV SOLUTION) ×3 IMPLANT
PAD ARMBOARD 7.5X6 YLW CONV (MISCELLANEOUS) ×6 IMPLANT
PENCIL BUTTON HOLSTER BLD 10FT (ELECTRODE) ×3 IMPLANT
SOLUTION PARTIC MCRMTRX 1000MG (Tissue) ×1 IMPLANT
SUT CHROMIC 4 0 P 3 18 (SUTURE) IMPLANT
SUT ETHILON 4 0 PS 2 18 (SUTURE) IMPLANT
SUT ETHILON 5 0 P 3 18 (SUTURE)
SUT NYLON ETHILON 5-0 P-3 1X18 (SUTURE) IMPLANT
SUT SILK 3 0 SH 30 (SUTURE) ×3 IMPLANT
SUT SILK 4 0 (SUTURE)
SUT SILK 4-0 18XBRD TIE 12 (SUTURE) IMPLANT
SUT VIC AB 5-0 PS2 18 (SUTURE) ×6 IMPLANT
SWAB COLLECTION DEVICE MRSA (MISCELLANEOUS) IMPLANT
SWAB CULTURE ESWAB REG 1ML (MISCELLANEOUS) IMPLANT
SYR BULB IRRIGATION 50ML (SYRINGE) IMPLANT
SYR TB 1ML LUER SLIP (SYRINGE) IMPLANT
TOWEL OR 17X24 6PK STRL BLUE (TOWEL DISPOSABLE) IMPLANT
TOWEL OR 17X26 10 PK STRL BLUE (TOWEL DISPOSABLE) ×3 IMPLANT
TRAY ENT MC OR (CUSTOM PROCEDURE TRAY) ×3 IMPLANT
WATER STERILE IRR 1000ML POUR (IV SOLUTION) ×3 IMPLANT
WOUND MATRIX 3-LAYER 5X5 (Tissue) ×1 IMPLANT
YANKAUER SUCT BULB TIP NO VENT (SUCTIONS) IMPLANT

## 2018-02-24 NOTE — Op Note (Signed)
DATE OF OPERATION: 02/24/2018  LOCATION: [facility]  PREOPERATIVE DIAGNOSIS: Squamous cell carcinoma of scalp with positive deep margin  POSTOPERATIVE DIAGNOSIS: Same  PROCEDURE: Excision of 4 x 4 cm deep margin from positive margin with squamous cell carcinoma of the scalp, placement of Acell (5 x 5 cm sheet and 1 gm powder)  SURGEON: Claire Sanger Dillingham, DO  ASSISTANT: Carmen Mayo, PA  EBL: 2 cc  CONDITION: Stable  COMPLICATIONS: None  INDICATION: The patient, Patrick Coleman, is a 77 y.o. male born on Jul 31, 1940, is here for treatment of a positive deep margin after excision of SCC of the scalp.   PROCEDURE DETAILS:  The patient was seen prior to surgery and marked.  The IV antibiotics were given. The patient was taken to the operating room and given a general anesthetic. A standard time out was performed and all information was confirmed by those in the room. SCDs were placed.   The scalp was prepped and draped with betadine.  Local was injected around the lesion for intraoperative hemostasis and post operative pain control.  The #10 blade was used to excise the superficial margin of granulation tissue with the superficial portion inked 5 x 5 cm.  The mallet and chizel were used to excise the skull bone 4 x 4 cm.  All of the acell was applied and secured with the 5-0 Vicryl.  The sorbact was sutured and KY gel and 4 x 4 gauze applied.  The patient was allowed to wake up and taken to recovery room in stable condition at the end of the case. The family was notified at the end of the case.

## 2018-02-24 NOTE — Interval H&P Note (Signed)
History and Physical Interval Note:  02/24/2018 7:30 AM  Patrick Coleman  has presented today for surgery, with the diagnosis of Squamous Cell Carcinoma Of Scalp  The various methods of treatment have been discussed with the patient and family. After consideration of risks, benefits and other options for treatment, the patient has consented to  Procedure(s): Excision of skull squamous cell carinoma (N/A) with application of A-cell (N/A) as a surgical intervention .  The patient's history has been reviewed, patient examined, no change in status, stable for surgery.  I have reviewed the patient's chart and labs.  Questions were answered to the patient's satisfaction.     Loel Lofty Nichols Corter

## 2018-02-24 NOTE — Anesthesia Postprocedure Evaluation (Signed)
Anesthesia Post Note  Patient: Patrick Coleman  Procedure(s) Performed: Excision of skull squamous cell carinoma (N/A ) with application of A-cell (N/A )     Patient location during evaluation: PACU Anesthesia Type: General Level of consciousness: awake and alert and oriented Pain management: pain level controlled Vital Signs Assessment: post-procedure vital signs reviewed and stable Respiratory status: spontaneous breathing, nonlabored ventilation and respiratory function stable Cardiovascular status: blood pressure returned to baseline and stable Postop Assessment: no apparent nausea or vomiting Anesthetic complications: no    Last Vitals:  Vitals:   02/24/18 0941 02/24/18 0945  BP: 104/61   Pulse: 62   Resp: 19   Temp:  36.7 C  SpO2: 92%     Last Pain:  Vitals:   02/24/18 0930  TempSrc:   PainSc: 0-No pain                 Rainie Crenshaw A.

## 2018-02-24 NOTE — Transfer of Care (Signed)
Immediate Anesthesia Transfer of Care Note  Patient: Patrick Coleman  Procedure(s) Performed: Excision of skull squamous cell carinoma (N/A ) with application of A-cell (N/A )  Patient Location: PACU  Anesthesia Type:General  Level of Consciousness: drowsy and patient cooperative  Airway & Oxygen Therapy: Patient Spontanous Breathing and Patient connected to nasal cannula oxygen  Post-op Assessment: Report given to RN and Post -op Vital signs reviewed and stable  Post vital signs: Reviewed and stable  Last Vitals:  Vitals Value Taken Time  BP 122/71 02/24/2018  8:41 AM  Temp    Pulse 68 02/24/2018  8:43 AM  Resp 16 02/24/2018  8:43 AM  SpO2 95 % 02/24/2018  8:43 AM  Vitals shown include unvalidated device data.  Last Pain:  Vitals:   02/24/18 0637  TempSrc:   PainSc: 0-No pain         Complications: No apparent anesthesia complications

## 2018-02-24 NOTE — Discharge Instructions (Signed)
Keep scalp dry. Keep wrap in place for two days. Then change the wrap daily and apply KY gel.

## 2018-02-24 NOTE — Anesthesia Procedure Notes (Signed)
Procedure Name: LMA Insertion Date/Time: 02/24/2018 7:50 AM Performed by: White, Amedeo Plenty, CRNA Pre-anesthesia Checklist: Patient identified, Emergency Drugs available, Suction available and Patient being monitored Patient Re-evaluated:Patient Re-evaluated prior to induction Oxygen Delivery Method: Circle System Utilized Preoxygenation: Pre-oxygenation with 100% oxygen Induction Type: IV induction Ventilation: Mask ventilation without difficulty LMA: LMA inserted LMA Size: 5.0 Number of attempts: 1 Airway Equipment and Method: Bite block Placement Confirmation: positive ETCO2 Tube secured with: Tape Dental Injury: Teeth and Oropharynx as per pre-operative assessment

## 2018-02-25 ENCOUNTER — Encounter (HOSPITAL_COMMUNITY): Payer: Self-pay | Admitting: Plastic Surgery

## 2018-02-27 DIAGNOSIS — S62646A Nondisplaced fracture of proximal phalanx of right little finger, initial encounter for closed fracture: Secondary | ICD-10-CM | POA: Diagnosis not present

## 2018-03-04 ENCOUNTER — Encounter: Payer: Self-pay | Admitting: Physician Assistant

## 2018-03-04 ENCOUNTER — Ambulatory Visit (INDEPENDENT_AMBULATORY_CARE_PROVIDER_SITE_OTHER): Payer: PPO | Admitting: Physician Assistant

## 2018-03-04 VITALS — BP 100/70 | HR 61 | Ht 70.0 in | Wt 195.0 lb

## 2018-03-04 DIAGNOSIS — C4442 Squamous cell carcinoma of skin of scalp and neck: Secondary | ICD-10-CM

## 2018-03-04 NOTE — Progress Notes (Signed)
  Subjective:     Patient ID: Patrick Coleman, male   DOB: 14-Jan-1941, 77 y.o.   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 The pt is accompanied by his daughter today in clinic.  Pt reports doing well.  He denies pain.  He reports compliance with ky application and bandage changes twice a day.  Dr. Marla Roe did inform the pt and his daughter that the path report was positive for moderately differentiated squamous cell present at the 12 o'clock anterior margin.  Dr. Marla Roe did let the patient and his daughter know they had 3 options.  The first was to do nothing, the second was to go ahead and try to reexcised right away, and the third was to allow healing to take place and then after allowing the area to heal she could excise a smaller  area where squamous cells are still present. Pt and daughter report compliance with regular dressing changes.   Review of Systems  Constitutional: Negative.   Respiratory: Negative.   Cardiovascular: Negative.   Skin: Positive for wound.  Neurological: Negative.   Psychiatric/Behavioral: Negative.        Objective:   Physical Exam Constitutional:      Appearance: Normal appearance.  Pulmonary:     Effort: Pulmonary effort is normal.  Abdominal:     Palpations: Abdomen is soft.  Skin:    General: Skin is warm and dry.  Neurological:     Mental Status: He is alert and oriented to person, place, and time.  Psychiatric:        Mood and Affect: Mood normal.        Behavior: Behavior normal.        Thought Content: Thought content normal.        Judgment: Judgment normal.    Visualized the pt 4x4 wound.  Adaptic in place.     Assessment:     S/p Excision of squamous cell carcinoma of the scalp    Plan:     Replaced the pts bandage today in clinic After discussion the pt and daughter would like to allow  healing before trying to excise more tissue to remove remaining squamous cell Pt will return to clinic in 10 days

## 2018-03-06 DIAGNOSIS — M6281 Muscle weakness (generalized): Secondary | ICD-10-CM | POA: Diagnosis not present

## 2018-03-06 DIAGNOSIS — S62646S Nondisplaced fracture of proximal phalanx of right little finger, sequela: Secondary | ICD-10-CM | POA: Diagnosis not present

## 2018-03-06 DIAGNOSIS — M25541 Pain in joints of right hand: Secondary | ICD-10-CM | POA: Diagnosis not present

## 2018-03-06 DIAGNOSIS — M25641 Stiffness of right hand, not elsewhere classified: Secondary | ICD-10-CM | POA: Diagnosis not present

## 2018-03-10 DIAGNOSIS — M25641 Stiffness of right hand, not elsewhere classified: Secondary | ICD-10-CM | POA: Diagnosis not present

## 2018-03-10 DIAGNOSIS — S62646S Nondisplaced fracture of proximal phalanx of right little finger, sequela: Secondary | ICD-10-CM | POA: Diagnosis not present

## 2018-03-10 DIAGNOSIS — M25541 Pain in joints of right hand: Secondary | ICD-10-CM | POA: Diagnosis not present

## 2018-03-10 DIAGNOSIS — I10 Essential (primary) hypertension: Secondary | ICD-10-CM | POA: Diagnosis not present

## 2018-03-10 DIAGNOSIS — E782 Mixed hyperlipidemia: Secondary | ICD-10-CM | POA: Diagnosis not present

## 2018-03-10 DIAGNOSIS — M6281 Muscle weakness (generalized): Secondary | ICD-10-CM | POA: Diagnosis not present

## 2018-03-14 ENCOUNTER — Encounter: Payer: Self-pay | Admitting: Plastic Surgery

## 2018-03-14 ENCOUNTER — Ambulatory Visit (INDEPENDENT_AMBULATORY_CARE_PROVIDER_SITE_OTHER): Payer: PPO | Admitting: Plastic Surgery

## 2018-03-14 VITALS — BP 115/70 | HR 89 | Temp 98.1°F | Ht 70.0 in | Wt 195.0 lb

## 2018-03-14 DIAGNOSIS — S62646S Nondisplaced fracture of proximal phalanx of right little finger, sequela: Secondary | ICD-10-CM | POA: Diagnosis not present

## 2018-03-14 DIAGNOSIS — M25641 Stiffness of right hand, not elsewhere classified: Secondary | ICD-10-CM | POA: Diagnosis not present

## 2018-03-14 DIAGNOSIS — M25541 Pain in joints of right hand: Secondary | ICD-10-CM | POA: Diagnosis not present

## 2018-03-14 DIAGNOSIS — C4442 Squamous cell carcinoma of skin of scalp and neck: Secondary | ICD-10-CM

## 2018-03-14 DIAGNOSIS — M6281 Muscle weakness (generalized): Secondary | ICD-10-CM | POA: Diagnosis not present

## 2018-03-14 NOTE — Progress Notes (Signed)
   Subjective:    Patient ID: EMMANUEL GRUENHAGEN, male    DOB: Oct 13, 1940, 78 y.o.   MRN: [id number]  Mr. Brahmbhatt is a 78 year old white male here with his daughter for follow-up on his squamous cell excision from his scalp.  Today we removed the sore back.  He has very good granulation tissue.  There is still some of the ACell incorporating.  There is no sign of infection.  Overall he is doing very well.  He does have a fracture of his right hand due to his fall from a few weeks ago.  His face has healed nicely from the fall.  The family is doing a great job with Cascade daily to the wound.     Review of Systems  Constitutional: Negative.   HENT: Negative.   Eyes: Negative.   Respiratory: Negative.   Gastrointestinal: Negative.   Musculoskeletal: Negative.   Skin: Positive for wound.       Objective:   Physical Exam Vitals signs and nursing note reviewed.  Constitutional:      Appearance: Normal appearance.  HENT:     Head: Normocephalic.  Cardiovascular:     Rate and Rhythm: Normal rate.  Neurological:     Mental Status: He is alert. Mental status is at baseline.  Psychiatric:        Mood and Affect: Mood normal.        Thought Content: Thought content normal.       Assessment & Plan:  Squamous cell carcinoma of scalp  The dressing was changed with a new Adaptic placed with KY and sterile dressing.  Recommend at least once a day KY dressing placement.  He can get it wet at this point.  Recommend 2-week follow-up.

## 2018-03-17 DIAGNOSIS — M6281 Muscle weakness (generalized): Secondary | ICD-10-CM | POA: Diagnosis not present

## 2018-03-17 DIAGNOSIS — M25541 Pain in joints of right hand: Secondary | ICD-10-CM | POA: Diagnosis not present

## 2018-03-17 DIAGNOSIS — M25641 Stiffness of right hand, not elsewhere classified: Secondary | ICD-10-CM | POA: Diagnosis not present

## 2018-03-17 DIAGNOSIS — S62646S Nondisplaced fracture of proximal phalanx of right little finger, sequela: Secondary | ICD-10-CM | POA: Diagnosis not present

## 2018-03-19 DIAGNOSIS — S62646S Nondisplaced fracture of proximal phalanx of right little finger, sequela: Secondary | ICD-10-CM | POA: Diagnosis not present

## 2018-03-19 DIAGNOSIS — M6281 Muscle weakness (generalized): Secondary | ICD-10-CM | POA: Diagnosis not present

## 2018-03-19 DIAGNOSIS — M25641 Stiffness of right hand, not elsewhere classified: Secondary | ICD-10-CM | POA: Diagnosis not present

## 2018-03-19 DIAGNOSIS — M25541 Pain in joints of right hand: Secondary | ICD-10-CM | POA: Diagnosis not present

## 2018-03-24 DIAGNOSIS — M25641 Stiffness of right hand, not elsewhere classified: Secondary | ICD-10-CM | POA: Diagnosis not present

## 2018-03-24 DIAGNOSIS — S62646S Nondisplaced fracture of proximal phalanx of right little finger, sequela: Secondary | ICD-10-CM | POA: Diagnosis not present

## 2018-03-24 DIAGNOSIS — M6281 Muscle weakness (generalized): Secondary | ICD-10-CM | POA: Diagnosis not present

## 2018-03-24 DIAGNOSIS — M25541 Pain in joints of right hand: Secondary | ICD-10-CM | POA: Diagnosis not present

## 2018-03-26 DIAGNOSIS — M25541 Pain in joints of right hand: Secondary | ICD-10-CM | POA: Diagnosis not present

## 2018-03-26 DIAGNOSIS — S62646S Nondisplaced fracture of proximal phalanx of right little finger, sequela: Secondary | ICD-10-CM | POA: Diagnosis not present

## 2018-03-26 DIAGNOSIS — M6281 Muscle weakness (generalized): Secondary | ICD-10-CM | POA: Diagnosis not present

## 2018-03-26 DIAGNOSIS — M25641 Stiffness of right hand, not elsewhere classified: Secondary | ICD-10-CM | POA: Diagnosis not present

## 2018-03-31 DIAGNOSIS — S62646S Nondisplaced fracture of proximal phalanx of right little finger, sequela: Secondary | ICD-10-CM | POA: Diagnosis not present

## 2018-03-31 DIAGNOSIS — M25541 Pain in joints of right hand: Secondary | ICD-10-CM | POA: Diagnosis not present

## 2018-03-31 DIAGNOSIS — M6281 Muscle weakness (generalized): Secondary | ICD-10-CM | POA: Diagnosis not present

## 2018-03-31 DIAGNOSIS — M25641 Stiffness of right hand, not elsewhere classified: Secondary | ICD-10-CM | POA: Diagnosis not present

## 2018-04-01 ENCOUNTER — Encounter: Payer: Self-pay | Admitting: Plastic Surgery

## 2018-04-01 ENCOUNTER — Ambulatory Visit (INDEPENDENT_AMBULATORY_CARE_PROVIDER_SITE_OTHER): Payer: PPO | Admitting: Plastic Surgery

## 2018-04-01 VITALS — BP 115/75 | HR 90 | Ht 70.0 in | Wt 197.0 lb

## 2018-04-01 DIAGNOSIS — C4442 Squamous cell carcinoma of skin of scalp and neck: Secondary | ICD-10-CM | POA: Diagnosis not present

## 2018-04-01 NOTE — Progress Notes (Signed)
   Subjective:    Patient ID: Patrick Coleman, male    DOB: 15-Nov-1940, 78 y.o.   MRN: [id number]  The patient is a 78 year old male here for follow-up with his wife on a squamous cell carcinoma of his scalp.  The area is filling in very nicely.  There is a little bit of granulation tissue that is a little bit friable and therefore has a little bit of bleeding but nothing that is active.  No sign of infection.  The healing process is continuing and very encouraging.  We did discuss that there is still some bone involvement and the plan is to resect that as well.     Review of Systems  Constitutional: Negative.   HENT: Negative.   Respiratory: Negative.   Gastrointestinal: Negative.   Genitourinary: Negative.   Musculoskeletal: Negative.   Skin: Positive for wound.       Objective:   Physical Exam Vitals signs and nursing note reviewed.  Constitutional:      Appearance: Normal appearance.  HENT:     Right Ear: External ear normal.     Left Ear: External ear normal.  Eyes:     Extraocular Movements: Extraocular movements intact.  Cardiovascular:     Rate and Rhythm: Normal rate.  Neurological:     Mental Status: He is alert.  Psychiatric:        Mood and Affect: Mood normal.        Thought Content: Thought content normal.        Judgment: Judgment normal.       Skin assessment & Plan:  Squamous cell carcinoma of scalp  Donated a cell was applied and the wife was instructed on KY dressing changes daily to start on Thursday.  I would like t0 see them back in 2 weeks.

## 2018-04-02 DIAGNOSIS — M25641 Stiffness of right hand, not elsewhere classified: Secondary | ICD-10-CM | POA: Diagnosis not present

## 2018-04-02 DIAGNOSIS — M6281 Muscle weakness (generalized): Secondary | ICD-10-CM | POA: Diagnosis not present

## 2018-04-02 DIAGNOSIS — M25541 Pain in joints of right hand: Secondary | ICD-10-CM | POA: Diagnosis not present

## 2018-04-02 DIAGNOSIS — S62646S Nondisplaced fracture of proximal phalanx of right little finger, sequela: Secondary | ICD-10-CM | POA: Diagnosis not present

## 2018-04-07 DIAGNOSIS — M25641 Stiffness of right hand, not elsewhere classified: Secondary | ICD-10-CM | POA: Diagnosis not present

## 2018-04-07 DIAGNOSIS — S62646S Nondisplaced fracture of proximal phalanx of right little finger, sequela: Secondary | ICD-10-CM | POA: Diagnosis not present

## 2018-04-07 DIAGNOSIS — M6281 Muscle weakness (generalized): Secondary | ICD-10-CM | POA: Diagnosis not present

## 2018-04-07 DIAGNOSIS — M25541 Pain in joints of right hand: Secondary | ICD-10-CM | POA: Diagnosis not present

## 2018-04-15 ENCOUNTER — Encounter: Payer: Self-pay | Admitting: Plastic Surgery

## 2018-04-15 ENCOUNTER — Ambulatory Visit (INDEPENDENT_AMBULATORY_CARE_PROVIDER_SITE_OTHER): Payer: PPO | Admitting: Plastic Surgery

## 2018-04-15 VITALS — BP 106/71 | HR 95 | Ht 70.0 in | Wt 202.0 lb

## 2018-04-15 DIAGNOSIS — C4442 Squamous cell carcinoma of skin of scalp and neck: Secondary | ICD-10-CM

## 2018-04-15 NOTE — Progress Notes (Signed)
   Subjective:    Patient ID: Patrick Coleman, male    DOB: 1940/08/10, 78 y.o.   MRN: [id number]  The patient is a 78 year old male here for follow-up on his scalp wound.  He underwent excision of a squamous cell carcinoma and has done well.  The posterior portion has healed beautifully and is epithelialized.  There is no sign of infection.  The anterior part has been slow to heal.  The way it looks today it is very concerning for recurrent squamous cell.     Review of Systems  Constitutional: Negative for activity change and appetite change.  Respiratory: Negative.  Negative for shortness of breath.   Gastrointestinal: Negative for abdominal pain.  Genitourinary: Negative.   Musculoskeletal: Positive for gait problem.       Objective:   Physical Exam Vitals signs and nursing note reviewed.  Constitutional:      Appearance: Normal appearance.  HENT:     Head: Normocephalic.   Cardiovascular:     Rate and Rhythm: Normal rate.  Pulmonary:     Effort: Pulmonary effort is normal.  Neurological:     Mental Status: He is alert.  Psychiatric:        Mood and Affect: Mood normal.        Thought Content: Thought content normal.        Assessment & Plan:  Squamous cell carcinoma of scalp We will need to do a reexcision of the anteriormost portion of the wound as this is suspicious for squamous cell carcinoma.  We will go ahead and get the bone at the same time.

## 2018-04-15 NOTE — H&P (View-Only) (Signed)
   Subjective:    Patient ID: Patrick Coleman, male    DOB: [birth year], 78 y.o.   MRN: [id number]  The patient is a 78 year old male here for follow-up on his scalp wound.  He underwent excision of a squamous cell carcinoma and has done well.  The posterior portion has healed beautifully and is epithelialized.  There is no sign of infection.  The anterior part has been slow to heal.  The way it looks today it is very concerning for recurrent squamous cell.     Review of Systems  Constitutional: Negative for activity change and appetite change.  Respiratory: Negative.  Negative for shortness of breath.   Gastrointestinal: Negative for abdominal pain.  Genitourinary: Negative.   Musculoskeletal: Positive for gait problem.       Objective:   Physical Exam Vitals signs and nursing note reviewed.  Constitutional:      Appearance: Normal appearance.  HENT:     Head: Normocephalic.   Cardiovascular:     Rate and Rhythm: Normal rate.  Pulmonary:     Effort: Pulmonary effort is normal.  Neurological:     Mental Status: He is alert.  Psychiatric:        Mood and Affect: Mood normal.        Thought Content: Thought content normal.        Assessment & Plan:  Squamous cell carcinoma of scalp We will need to do a reexcision of the anteriormost portion of the wound as this is suspicious for squamous cell carcinoma.  We will go ahead and get the bone at the same time.

## 2018-04-25 ENCOUNTER — Encounter (HOSPITAL_COMMUNITY): Payer: Self-pay

## 2018-04-25 ENCOUNTER — Other Ambulatory Visit: Payer: Self-pay

## 2018-04-27 NOTE — Anesthesia Preprocedure Evaluation (Addendum)
Anesthesia Evaluation  Patient identified by MRN, date of birth, ID band Patient awake    Reviewed: Allergy & Precautions, NPO status , Patient's Chart, lab work & pertinent test results, reviewed documented beta blocker date and time   History of Anesthesia Complications Negative for: history of anesthetic complications  Airway Mallampati: III  TM Distance: >3 FB Neck ROM: Full    Dental  (+) Teeth Intact, Dental Advisory Given   Pulmonary neg pulmonary ROS,    Pulmonary exam normal breath sounds clear to auscultation       Cardiovascular hypertension, Pt. on home beta blockers and Pt. on medications Normal cardiovascular exam Rhythm:Regular Rate:Normal     Neuro/Psych Anxiety Depression Bipolar Disorder negative neurological ROS     GI/Hepatic negative GI ROS, Neg liver ROS,   Endo/Other  diabetes, Type 2  Renal/GU negative Renal ROS     Musculoskeletal negative musculoskeletal ROS (+)   Abdominal   Peds  Hematology negative hematology ROS (+)   Anesthesia Other Findings Day of surgery medications reviewed with the patient.  Reproductive/Obstetrics                            Anesthesia Physical Anesthesia Plan  ASA: II  Anesthesia Plan: General   Post-op Pain Management:    Induction: Intravenous  PONV Risk Score and Plan: 2 and Treatment may vary due to age or medical condition, Ondansetron and Dexamethasone  Airway Management Planned: LMA  Additional Equipment:   Intra-op Plan:   Post-operative Plan: Extubation in OR  Informed Consent: I have reviewed the patients History and Physical, chart, labs and discussed the procedure including the risks, benefits and alternatives for the proposed anesthesia with the patient or authorized representative who has indicated his/her understanding and acceptance.     Dental advisory given  Plan Discussed with: CRNA  Anesthesia  Plan Comments:       Anesthesia Quick Evaluation

## 2018-04-28 ENCOUNTER — Encounter (HOSPITAL_COMMUNITY)
Admission: RE | Disposition: A | Discharge status: Home / Self Care | Payer: Self-pay | Source: Home / Self Care | Attending: Plastic Surgery

## 2018-04-28 ENCOUNTER — Encounter (HOSPITAL_COMMUNITY): Payer: Self-pay

## 2018-04-28 ENCOUNTER — Ambulatory Visit (HOSPITAL_COMMUNITY): Payer: PPO | Admitting: Anesthesiology

## 2018-04-28 ENCOUNTER — Ambulatory Visit (HOSPITAL_COMMUNITY)
Admission: RE | Admit: 2018-04-28 | Discharge: 2018-04-28 | Disposition: A | Discharge status: Home / Self Care | Payer: PPO | Attending: Plastic Surgery | Admitting: Plastic Surgery

## 2018-04-28 ENCOUNTER — Telehealth: Payer: Self-pay | Admitting: Physician Assistant

## 2018-04-28 ENCOUNTER — Other Ambulatory Visit: Payer: Self-pay | Admitting: Physician Assistant

## 2018-04-28 ENCOUNTER — Other Ambulatory Visit: Payer: Self-pay

## 2018-04-28 DIAGNOSIS — E119 Type 2 diabetes mellitus without complications: Secondary | ICD-10-CM | POA: Diagnosis not present

## 2018-04-28 DIAGNOSIS — I10 Essential (primary) hypertension: Secondary | ICD-10-CM | POA: Diagnosis not present

## 2018-04-28 DIAGNOSIS — C410 Malignant neoplasm of bones of skull and face: Secondary | ICD-10-CM | POA: Diagnosis not present

## 2018-04-28 DIAGNOSIS — C4442 Squamous cell carcinoma of skin of scalp and neck: Secondary | ICD-10-CM | POA: Insufficient documentation

## 2018-04-28 HISTORY — PX: BASAL CELL CARCINOMA EXCISION: SHX1214

## 2018-04-28 LAB — GLUCOSE, CAPILLARY
Glucose-Capillary: 87 mg/dL (ref 70–99)
Glucose-Capillary: 115 mg/dL — ABNORMAL HIGH (ref 70–99)

## 2018-04-28 LAB — BASIC METABOLIC PANEL
Anion gap: 8 (ref 5–15)
BUN: 21 mg/dL (ref 8–23)
CO2: 25 mmol/L (ref 22–32)
Calcium: 9.1 mg/dL (ref 8.9–10.3)
Chloride: 107 mmol/L (ref 98–111)
Creatinine, Ser: 1.27 mg/dL — ABNORMAL HIGH (ref 0.61–1.24)
GFR calc Af Amer: >60 mL/min (ref >60)
GFR calc non Af Amer: 55 mL/min — ABNORMAL LOW (ref >60)
Glucose, Bld: 103 mg/dL — ABNORMAL HIGH (ref 70–99)
POTASSIUM: 4.3 mmol/L (ref 3.5–5.1)
Sodium: 140 mmol/L (ref 135–145)

## 2018-04-28 LAB — CBC
HCT: 41 % (ref 39.0–52.0)
Hemoglobin: 12.7 g/dL — ABNORMAL LOW (ref 13.0–17.0)
MCH: 31.1 pg (ref 26.0–34.0)
MCHC: 31 g/dL (ref 30.0–36.0)
MCV: 100.2 fL — ABNORMAL HIGH (ref 80.0–100.0)
Platelets: 128 10*3/uL — ABNORMAL LOW (ref 150–400)
RBC: 4.09 MIL/uL — ABNORMAL LOW (ref 4.22–5.81)
RDW: 13 % (ref 11.5–15.5)
WBC: 5.6 10*3/uL (ref 4.0–10.5)
nRBC: 0 % (ref 0.0–0.2)

## 2018-04-28 SURGERY — EXCISION, CARCINOMA, BASAL CELL, WITH FROZEN SECTION EXAMINATION
Anesthesia: General | Site: Scalp

## 2018-04-28 MED ORDER — EPHEDRINE SULFATE-NACL 50-0.9 MG/10ML-% IV SOSY
PREFILLED_SYRINGE | INTRAVENOUS | Status: DC | PRN
  Administered 2018-04-28 (×4): 10 mg via INTRAVENOUS

## 2018-04-28 MED ORDER — ONDANSETRON HCL 4 MG/2ML IJ SOLN
INTRAMUSCULAR | Status: AC
  Filled 2018-04-28: qty 2

## 2018-04-28 MED ORDER — ROCURONIUM BROMIDE 50 MG/5ML IV SOSY
PREFILLED_SYRINGE | INTRAVENOUS | Status: AC
  Filled 2018-04-28: qty 5

## 2018-04-28 MED ORDER — 0.9 % SODIUM CHLORIDE (POUR BTL) OPTIME
TOPICAL | Status: DC | PRN
  Administered 2018-04-28: 1000 mL

## 2018-04-28 MED ORDER — FENTANYL CITRATE (PF) 250 MCG/5ML IJ SOLN
INTRAMUSCULAR | Status: DC | PRN
  Administered 2018-04-28: 100 ug via INTRAVENOUS

## 2018-04-28 MED ORDER — DEXAMETHASONE SODIUM PHOSPHATE 10 MG/ML IJ SOLN
INTRAMUSCULAR | Status: DC | PRN
  Administered 2018-04-28: 4 mg via INTRAVENOUS

## 2018-04-28 MED ORDER — SODIUM CHLORIDE 0.9% FLUSH: 3.0000 mL | INTRAVENOUS | Status: DC | PRN

## 2018-04-28 MED ORDER — FENTANYL CITRATE (PF) 250 MCG/5ML IJ SOLN
INTRAMUSCULAR | Status: AC
  Filled 2018-04-28: qty 5

## 2018-04-28 MED ORDER — ACETAMINOPHEN 325 MG PO TABS: 650.0000 mg | ORAL_TABLET | ORAL | Status: DC | PRN

## 2018-04-28 MED ORDER — LIDOCAINE-EPINEPHRINE 1 %-1:100000 IJ SOLN
INTRAMUSCULAR | Status: AC
  Filled 2018-04-28: qty 1

## 2018-04-28 MED ORDER — CEPHALEXIN 500 MG PO CAPS: 500.0000 mg | ORAL_CAPSULE | Freq: Four times a day (QID) | ORAL | 0 refills | Status: DC

## 2018-04-28 MED ORDER — LIDOCAINE 2% (20 MG/ML) 5 ML SYRINGE
INTRAMUSCULAR | Status: DC | PRN
  Administered 2018-04-28: 100 mg via INTRAVENOUS

## 2018-04-28 MED ORDER — OXYCODONE HCL 5 MG/5ML PO SOLN: 5.0000 mg | Freq: Once | ORAL | Status: DC | PRN

## 2018-04-28 MED ORDER — ACETAMINOPHEN 650 MG RE SUPP: 650.0000 mg | RECTAL | Status: DC | PRN

## 2018-04-28 MED ORDER — LACTATED RINGERS IV SOLN
INTRAVENOUS | Status: DC | PRN
  Administered 2018-04-28: 07:00:00 via INTRAVENOUS

## 2018-04-28 MED ORDER — PROPOFOL 10 MG/ML IV BOLUS
INTRAVENOUS | Status: AC
  Filled 2018-04-28: qty 20

## 2018-04-28 MED ORDER — ONDANSETRON HCL 4 MG/2ML IJ SOLN
INTRAMUSCULAR | Status: DC | PRN
  Administered 2018-04-28: 4 mg via INTRAVENOUS

## 2018-04-28 MED ORDER — FENTANYL CITRATE (PF) 100 MCG/2ML IJ SOLN: 25.0000 ug | INTRAMUSCULAR | Status: DC | PRN

## 2018-04-28 MED ORDER — ONDANSETRON HCL 4 MG/2ML IJ SOLN: INTRAMUSCULAR | Status: DC | PRN

## 2018-04-28 MED ORDER — SODIUM CHLORIDE 0.9 % IV SOLN: 250.0000 mL | INTRAVENOUS | Status: DC | PRN

## 2018-04-28 MED ORDER — PROMETHAZINE HCL 25 MG/ML IJ SOLN: 6.2500 mg | INTRAMUSCULAR | Status: DC | PRN

## 2018-04-28 MED ORDER — SODIUM CHLORIDE 0.9% FLUSH: 3.0000 mL | Freq: Two times a day (BID) | INTRAVENOUS | Status: DC

## 2018-04-28 MED ORDER — OXYCODONE HCL 5 MG PO TABS: 5.0000 mg | ORAL_TABLET | ORAL | Status: DC | PRN

## 2018-04-28 MED ORDER — BACITRACIN ZINC 500 UNIT/GM EX OINT
TOPICAL_OINTMENT | CUTANEOUS | Status: AC
  Filled 2018-04-28: qty 28.35

## 2018-04-28 MED ORDER — DEXAMETHASONE SODIUM PHOSPHATE 10 MG/ML IJ SOLN
INTRAMUSCULAR | Status: AC
  Filled 2018-04-28: qty 1

## 2018-04-28 MED ORDER — ACETAMINOPHEN 10 MG/ML IV SOLN: 1000.0000 mg | Freq: Once | INTRAVENOUS | Status: DC | PRN

## 2018-04-28 MED ORDER — OXYCODONE HCL 5 MG PO TABS: 5.0000 mg | ORAL_TABLET | Freq: Once | ORAL | Status: DC | PRN

## 2018-04-28 MED ORDER — PROPOFOL 10 MG/ML IV BOLUS
INTRAVENOUS | Status: DC | PRN
  Administered 2018-04-28: 180 mg via INTRAVENOUS

## 2018-04-28 MED ORDER — LIDOCAINE 2% (20 MG/ML) 5 ML SYRINGE
INTRAMUSCULAR | Status: AC
  Filled 2018-04-28: qty 5

## 2018-04-28 MED ORDER — PHENYLEPHRINE 40 MCG/ML (10ML) SYRINGE FOR IV PUSH (FOR BLOOD PRESSURE SUPPORT)
PREFILLED_SYRINGE | INTRAVENOUS | Status: DC | PRN
  Administered 2018-04-28 (×3): 80 ug via INTRAVENOUS

## 2018-04-28 MED ORDER — BUPIVACAINE HCL (PF) 0.25 % IJ SOLN
INTRAMUSCULAR | Status: AC
  Filled 2018-04-28: qty 30

## 2018-04-28 MED ORDER — CEFAZOLIN SODIUM-DEXTROSE 2-4 GM/100ML-% IV SOLN
2.0000 g | INTRAVENOUS | Status: AC
  Administered 2018-04-28: 2 g via INTRAVENOUS
  Filled 2018-04-28: qty 100

## 2018-04-28 MED ORDER — LIDOCAINE-EPINEPHRINE 1 %-1:100000 IJ SOLN
INTRAMUSCULAR | Status: DC | PRN
  Administered 2018-04-28: 14 mL

## 2018-04-28 SURGICAL SUPPLY — 46 items
BLADE 10 SAFETY STRL DISP (BLADE) IMPLANT
BNDG CONFORM 2 STRL LF (GAUZE/BANDAGES/DRESSINGS) IMPLANT
BNDG GAUZE ELAST 4 BULKY (GAUZE/BANDAGES/DRESSINGS) IMPLANT
BUR EGG ELITE 5.0 (BURR) ×2 IMPLANT
BUR EGG ELITE 5.0MM (BURR) ×1
CANISTER SUCT 3000ML PPV (MISCELLANEOUS) IMPLANT
CLEANER TIP ELECTROSURG 2X2 (MISCELLANEOUS) ×3 IMPLANT
CONT SPEC 4OZ CLIKSEAL STRL BL (MISCELLANEOUS) ×3 IMPLANT
COVER SURGICAL LIGHT HANDLE (MISCELLANEOUS) ×3 IMPLANT
COVER WAND RF STERILE (DRAPES) IMPLANT
DRSG CUTIMED SORBACT 7X9 (GAUZE/BANDAGES/DRESSINGS) ×3 IMPLANT
DRSG EMULSION OIL 3X3 NADH (GAUZE/BANDAGES/DRESSINGS) IMPLANT
ELECT COATED BLADE 2.86 ST (ELECTRODE) IMPLANT
ELECT NEEDLE TIP 2.8 STRL (NEEDLE) ×3 IMPLANT
ELECT REM PT RETURN 9FT ADLT (ELECTROSURGICAL) ×3
ELECTRODE REM PT RTRN 9FT ADLT (ELECTROSURGICAL) ×1 IMPLANT
GAUZE 4X4 16PLY RFD (DISPOSABLE) IMPLANT
GAUZE SPONGE 4X4 12PLY STRL (GAUZE/BANDAGES/DRESSINGS) IMPLANT
GLOVE BIO SURGEON STRL SZ 6.5 (GLOVE) ×2 IMPLANT
GLOVE BIO SURGEONS STRL SZ 6.5 (GLOVE) ×1
GOWN STRL REUS W/ TWL LRG LVL3 (GOWN DISPOSABLE) ×2 IMPLANT
GOWN STRL REUS W/TWL LRG LVL3 (GOWN DISPOSABLE) ×4
KIT BASIN OR (CUSTOM PROCEDURE TRAY) ×3 IMPLANT
KIT TURNOVER KIT B (KITS) ×3 IMPLANT
MATRIX WOUND 3-LAYER 5X5 (Tissue) ×2 IMPLANT
MICROMATRIX 1000MG (Tissue) ×3 IMPLANT
NS IRRIG 1000ML POUR BTL (IV SOLUTION) ×3 IMPLANT
PACK GENERAL/GYN (CUSTOM PROCEDURE TRAY) ×3 IMPLANT
PAD ARMBOARD 7.5X6 YLW CONV (MISCELLANEOUS) ×6 IMPLANT
PENCIL BUTTON HOLSTER BLD 10FT (ELECTRODE) ×3 IMPLANT
SOLUTION PARTIC MCRMTRX 1000MG (Tissue) ×1 IMPLANT
SUT CHROMIC 4 0 P 3 18 (SUTURE) ×3 IMPLANT
SUT ETHILON 4 0 PS 2 18 (SUTURE) ×3 IMPLANT
SUT ETHILON 5 0 P 3 18 (SUTURE) ×2
SUT NYLON ETHILON 5-0 P-3 1X18 (SUTURE) ×1 IMPLANT
SUT SILK 3 0 SH 30 (SUTURE) ×3 IMPLANT
SUT SILK 4 0 (SUTURE) ×2
SUT SILK 4-0 18XBRD TIE 12 (SUTURE) ×1 IMPLANT
SUT VIC AB 5-0 PS2 18 (SUTURE) ×6 IMPLANT
SYR BULB IRRIGATION 50ML (SYRINGE) IMPLANT
TOWEL OR 17X24 6PK STRL BLUE (TOWEL DISPOSABLE) ×3 IMPLANT
TOWEL OR 17X26 10 PK STRL BLUE (TOWEL DISPOSABLE) ×3 IMPLANT
TRAY ENT MC OR (CUSTOM PROCEDURE TRAY) IMPLANT
WATER STERILE IRR 1000ML POUR (IV SOLUTION) ×3 IMPLANT
WOUND MATRIX 3-LAYER 5X5 (Tissue) ×1 IMPLANT
YANKAUER SUCT BULB TIP NO VENT (SUCTIONS) IMPLANT

## 2018-04-28 NOTE — Anesthesia Procedure Notes (Signed)
Procedure Name: LMA Insertion Date/Time: 04/28/2018 7:36 AM Performed by: Bryson Corona, CRNA Pre-anesthesia Checklist: Patient identified, Emergency Drugs available, Suction available and Patient being monitored Patient Re-evaluated:Patient Re-evaluated prior to induction Oxygen Delivery Method: Circle System Utilized Preoxygenation: Pre-oxygenation with 100% oxygen Induction Type: IV induction Ventilation: Mask ventilation without difficulty LMA: LMA inserted LMA Size: 5.0 Number of attempts: 1 Placement Confirmation: positive ETCO2 Tube secured with: Tape Dental Injury: Teeth and Oropharynx as per pre-operative assessment

## 2018-04-28 NOTE — Telephone Encounter (Signed)
Sent in post op anx Contacted the pts home Told his wife they should be able to pick up soon  [place], [facility]

## 2018-04-28 NOTE — Interval H&P Note (Signed)
History and Physical Interval Note:  04/28/2018 7:24 AM  Patrick Coleman  has presented today for surgery, with the diagnosis of Skin Ulcer With Fat Layer Exposed  The various methods of treatment have been discussed with the patient and family. After consideration of risks, benefits and other options for treatment, the patient has consented to  Procedure(s): Excision of skin cancer scalp with acell placement (N/A) as a surgical intervention .  The patient's history has been reviewed, patient examined, no change in status, stable for surgery.  I have reviewed the patient's chart and labs.  Questions were answered to the patient's satisfaction.     Loel Lofty Dillingham

## 2018-04-28 NOTE — Op Note (Signed)
DATE OF OPERATION: 04/28/2018  LOCATION: [facility]  PREOPERATIVE DIAGNOSIS: Squamous cell carcinoma of scalp  POSTOPERATIVE DIAGNOSIS: Same  PROCEDURE: Excision of squamous cell carcinoma of scalp 5 x 5 skin and 3 x 3 cm bone, placement of Acell (5 x 5 cm sheet and 1 gm powder)  SURGEON: Jearlene Bridwell [place]anger Bradan Congrove, DO  ASSISTANT: Carmen Mayo, PA  EBL: 2 cc  CONDITION: Stable  COMPLICATIONS: None  INDICATION: The patient, Patrick Coleman, is a 78 y.o. male born on 11-30-1940, is here for treatment of squamous cell carcinoma.   PROCEDURE DETAILS:  The patient was seen prior to surgery and marked.  The IV antibiotics were given. The patient was taken to the operating room and given a general anesthetic. A standard time out was performed and all information was confirmed by those in the room. SCDs were placed.   The scalp was prepped and draped.  Local was injected for intraoperative hemostasis.  The needle tip bovie was used to excise the 5 x 5 cm area of the superior aspect of the previous wound / excision site.  A short stitch was placed at 12 o'clock and a long one at 9 o'clock.  The deep soft tissue was sent with the same markings.  The osteotome was used to excise the bone at the base.  There was abnormal looking bone.  It was soft and comprised of a 3 x 3 cm area.  The decision was made to use the burr for safety.  Hemostasis was achieved with electrocautery.  All of the acell powder was applied.  The sheet was then placed and secured with the 5-0 Vicryl.  The sorbact was placed and secured with the 5-0 Vicryl.  KY gel and gauze was applied.  The patient was allowed to wake up and taken to recovery room in stable condition at the end of the case. The family was notified at the end of the case.  The advanced practice practitioner (APP) assisted throughout the case.  The APP was essential in retraction and counter traction when needed to make the case progress smoothly.   This retraction and assistance made it possible to see the tissue plans for the procedure.  The assistance was needed for blood control, tissue re-approximation and assisted with closure of the incision site.

## 2018-04-28 NOTE — Transfer of Care (Signed)
Immediate Anesthesia Transfer of Care Note  Patient: Patrick Coleman  Procedure(s) Performed: Excision of skin cancer scalp with acell placement (N/A Scalp)  Patient Location: PACU  Anesthesia Type:General  Level of Consciousness: drowsy  Airway & Oxygen Therapy: Patient Spontanous Breathing and Patient connected to face mask oxygen  Post-op Assessment: Report given to RN and Post -op Vital signs reviewed and stable  Post vital signs: Reviewed and stable  Last Vitals:  Vitals Value Taken Time  BP 123/68 04/28/2018  8:32 AM  Temp    Pulse 58 04/28/2018  8:34 AM  Resp 13 04/28/2018  8:34 AM  SpO2 100 % 04/28/2018  8:34 AM  Vitals shown include unvalidated device data.  Last Pain:  Vitals:   04/28/18 0634  PainSc: 0-No pain      Patients Stated Pain Goal: 4 (04/49/25 2415)  Complications: No apparent anesthesia complications

## 2018-04-28 NOTE — Anesthesia Postprocedure Evaluation (Signed)
Anesthesia Post Note  Patient: Patrick Coleman  Procedure(s) Performed: Excision of skin cancer scalp with acell placement (N/A Scalp)     Patient location during evaluation: PACU Anesthesia Type: General Level of consciousness: awake and alert Pain management: pain level controlled Vital Signs Assessment: post-procedure vital signs reviewed and stable Respiratory status: spontaneous breathing, nonlabored ventilation and respiratory function stable Cardiovascular status: blood pressure returned to baseline and stable Postop Assessment: no apparent nausea or vomiting Anesthetic complications: no    Last Vitals:  Vitals:   04/28/18 0910 04/28/18 0912  BP:    Pulse: 70   Resp: 12 15  Temp:  (!) 36.4 C  SpO2: 91%     Last Pain:  Vitals:   04/28/18 0912  PainSc: 0-No pain                 Brennan Bailey

## 2018-04-29 ENCOUNTER — Encounter (HOSPITAL_COMMUNITY): Payer: Self-pay | Admitting: Plastic Surgery

## 2018-04-29 ENCOUNTER — Ambulatory Visit: Payer: PPO | Admitting: Plastic Surgery

## 2018-05-06 ENCOUNTER — Ambulatory Visit (INDEPENDENT_AMBULATORY_CARE_PROVIDER_SITE_OTHER): Payer: PPO | Admitting: Plastic Surgery

## 2018-05-06 ENCOUNTER — Encounter: Payer: Self-pay | Admitting: Plastic Surgery

## 2018-05-06 VITALS — BP 109/74 | HR 74 | Temp 98.9°F

## 2018-05-06 DIAGNOSIS — C4442 Squamous cell carcinoma of skin of scalp and neck: Secondary | ICD-10-CM

## 2018-05-06 NOTE — Progress Notes (Signed)
   Subjective:    Patient ID: Patrick Coleman, male    DOB: [birth year], 78 y.o.   MRN: [id number]  The patient is a 78 year old male here with his wife for follow-up on a scalp cancer.  He underwent excision of a squamous cell carcinoma.  The path was positive the skin edges were clear but the deep margins were positive.  The outer table was sent and positive as well.  The remaining portion was burred.  This likely took care of the situation however we will seek oncology consultation.  This has been a very aggressive skin cancer.  The ACell is in place as well as the sore back.  There is no sign of infection.  They have been using Nenana daily.     Review of Systems  Constitutional: Negative.   HENT: Negative.   Eyes: Negative.   Respiratory: Negative.   Endocrine: Negative.   Genitourinary: Negative.   Musculoskeletal: Negative.   Skin: Positive for wound.       Objective:   Physical Exam Vitals signs and nursing note reviewed.  Constitutional:      Appearance: Normal appearance.  HENT:     Head:   Cardiovascular:     Rate and Rhythm: Normal rate and regular rhythm.  Neurological:     Mental Status: He is alert.  Psychiatric:        Mood and Affect: Mood normal.       Assessment & Plan:  Squamous cell carcinoma of scalp Continue with KY dressings 1 or 2 times a day I would like to see him back in 1 week.  We will remove the sore back at that time.  We will work on an oncology consult for April.

## 2018-05-07 ENCOUNTER — Encounter (HOSPITAL_COMMUNITY): Payer: Self-pay | Admitting: Plastic Surgery

## 2018-05-13 ENCOUNTER — Ambulatory Visit (INDEPENDENT_AMBULATORY_CARE_PROVIDER_SITE_OTHER): Payer: PPO | Admitting: Plastic Surgery

## 2018-05-13 ENCOUNTER — Encounter: Payer: Self-pay | Admitting: Plastic Surgery

## 2018-05-13 VITALS — BP 114/77 | HR 61 | Temp 99.2°F | Ht 70.0 in | Wt 205.0 lb

## 2018-05-13 DIAGNOSIS — E1122 Type 2 diabetes mellitus with diabetic chronic kidney disease: Secondary | ICD-10-CM | POA: Diagnosis not present

## 2018-05-13 DIAGNOSIS — E7800 Pure hypercholesterolemia, unspecified: Secondary | ICD-10-CM | POA: Diagnosis not present

## 2018-05-13 DIAGNOSIS — C4442 Squamous cell carcinoma of skin of scalp and neck: Secondary | ICD-10-CM

## 2018-05-13 DIAGNOSIS — G20 Parkinson's disease: Secondary | ICD-10-CM | POA: Diagnosis not present

## 2018-05-13 DIAGNOSIS — E782 Mixed hyperlipidemia: Secondary | ICD-10-CM | POA: Diagnosis not present

## 2018-05-13 DIAGNOSIS — I10 Essential (primary) hypertension: Secondary | ICD-10-CM | POA: Diagnosis not present

## 2018-05-13 NOTE — Progress Notes (Signed)
   Subjective:    Patient ID: Patrick Coleman, male    DOB: [birth year], 78 y.o.   MRN: [id number]  The patient is a 78 year old male here with his wife for follow-up on his squamous cell carcinoma excision.  He was set up for oncology here at [facility] long.  He is requested oncology and is being due to there residents location to the ED and site.  Think that is reasonable.  There is no sign of infection.  The ACell is incorporating but is a little bit dry.  They have been using [place] dressing's once a day.  He has not had any fevers.  The wife is concerned that he that his C. difficile might be acting up.  They were instructed to call the PCP.   Review of Systems  Constitutional: Negative.   Eyes: Negative.   Respiratory: Negative.  Negative for shortness of breath.   Gastrointestinal: Positive for diarrhea.  Skin: Positive for wound.  Psychiatric/Behavioral: Negative.        Objective:   Physical Exam Vitals signs and nursing note reviewed.  HENT:     Head: Normocephalic.  Pulmonary:     Effort: Pulmonary effort is normal.  Neurological:     General: No focal deficit present.     Mental Status: He is alert.  Psychiatric:        Mood and Affect: Mood normal.        Thought Content: Thought content normal.         Assessment & Plan:  Squamous cell carcinoma of scalp Continue with KY gel and apply twice a day.  Follow-up in 1 week.  Call PCP for further evaluation of C. difficile.

## 2018-05-14 ENCOUNTER — Telehealth: Payer: Self-pay | Admitting: *Deleted

## 2018-05-14 NOTE — Telephone Encounter (Signed)
Oncology Nurse Navigator Documentation  In follow-up to referral rec'd yesterday, spoke with patient's wife who confirmed they decided to have tmt at [facility] which is [place] where they live.  [place] Referral coordinator Seth Bake informed.  Gayleen Orem, RN, BSN Head & Neck Oncology Nurse [facility] at [place] [phone]

## 2018-05-19 DIAGNOSIS — D649 Anemia, unspecified: Secondary | ICD-10-CM | POA: Diagnosis not present

## 2018-05-19 DIAGNOSIS — C4442 Squamous cell carcinoma of skin of scalp and neck: Secondary | ICD-10-CM | POA: Diagnosis not present

## 2018-05-20 ENCOUNTER — Ambulatory Visit: Payer: PPO | Admitting: Oncology

## 2018-05-20 DIAGNOSIS — N401 Enlarged prostate with lower urinary tract symptoms: Secondary | ICD-10-CM | POA: Diagnosis not present

## 2018-05-20 DIAGNOSIS — Z6829 Body mass index (BMI) 29.0-29.9, adult: Secondary | ICD-10-CM | POA: Diagnosis not present

## 2018-05-20 DIAGNOSIS — K8080 Other cholelithiasis without obstruction: Secondary | ICD-10-CM | POA: Diagnosis not present

## 2018-05-20 DIAGNOSIS — I10 Essential (primary) hypertension: Secondary | ICD-10-CM | POA: Diagnosis not present

## 2018-05-20 DIAGNOSIS — R7301 Impaired fasting glucose: Secondary | ICD-10-CM | POA: Diagnosis not present

## 2018-05-20 DIAGNOSIS — Z85828 Personal history of other malignant neoplasm of skin: Secondary | ICD-10-CM | POA: Diagnosis not present

## 2018-05-20 DIAGNOSIS — L0100 Impetigo, unspecified: Secondary | ICD-10-CM | POA: Diagnosis not present

## 2018-05-20 DIAGNOSIS — R634 Abnormal weight loss: Secondary | ICD-10-CM | POA: Diagnosis not present

## 2018-05-20 DIAGNOSIS — G20 Parkinson's disease: Secondary | ICD-10-CM | POA: Diagnosis not present

## 2018-05-20 DIAGNOSIS — C4490 Unspecified malignant neoplasm of skin, unspecified: Secondary | ICD-10-CM | POA: Diagnosis not present

## 2018-05-21 ENCOUNTER — Ambulatory Visit: Payer: PPO

## 2018-05-21 ENCOUNTER — Other Ambulatory Visit (HOSPITAL_COMMUNITY): Payer: Self-pay | Admitting: Oncology

## 2018-05-21 ENCOUNTER — Other Ambulatory Visit: Payer: Self-pay

## 2018-05-21 VITALS — BP 115/78 | HR 76 | Temp 98.2°F

## 2018-05-21 DIAGNOSIS — C4442 Squamous cell carcinoma of skin of scalp and neck: Secondary | ICD-10-CM

## 2018-05-23 ENCOUNTER — Ambulatory Visit: Payer: PPO | Admitting: Plastic Surgery

## 2018-05-23 DIAGNOSIS — C4442 Squamous cell carcinoma of skin of scalp and neck: Secondary | ICD-10-CM | POA: Diagnosis not present

## 2018-05-26 ENCOUNTER — Ambulatory Visit (HOSPITAL_COMMUNITY)
Admission: RE | Admit: 2018-05-26 | Discharge: 2018-05-26 | Disposition: A | Discharge status: Home / Self Care | Payer: PPO | Source: Ambulatory Visit | Attending: Oncology | Admitting: Oncology

## 2018-05-26 ENCOUNTER — Other Ambulatory Visit: Payer: Self-pay

## 2018-05-26 DIAGNOSIS — C4442 Squamous cell carcinoma of skin of scalp and neck: Secondary | ICD-10-CM | POA: Diagnosis not present

## 2018-05-26 LAB — GLUCOSE, CAPILLARY: Glucose-Capillary: 83 mg/dL (ref 70–99)

## 2018-05-26 MED ORDER — FLUDEOXYGLUCOSE F - 18 (FDG) INJECTION
11.0000 | Freq: Once | INTRAVENOUS | Status: AC | PRN
  Administered 2018-05-26: 11 via INTRAVENOUS

## 2018-05-27 ENCOUNTER — Ambulatory Visit (INDEPENDENT_AMBULATORY_CARE_PROVIDER_SITE_OTHER): Payer: PPO | Admitting: Plastic Surgery

## 2018-05-27 ENCOUNTER — Encounter: Payer: Self-pay | Admitting: Plastic Surgery

## 2018-05-27 VITALS — BP 167/91 | HR 64 | Temp 98.2°F | Wt 204.0 lb

## 2018-05-27 DIAGNOSIS — C4442 Squamous cell carcinoma of skin of scalp and neck: Secondary | ICD-10-CM

## 2018-05-27 NOTE — Progress Notes (Signed)
   Subjective:    Patient ID: Patrick Coleman, male    DOB: [birth year], 78 y.o.   MRN: [id number]  The patient is a 78 year old male here with his wife for follow-up on his scalp wound.  He had resection of skin cancer.  He had positive margins but did have the bone burred.  He is being worked up with MRI and scans for any metastasis.  He is showing signs of healing.  It is slow.  He had a little bit of bleeding.  This was controlled with pressure and we cleaned up the area.  He has of 1 cm area of bone exposed in the center of the wound.  The rest is doing very well.   Review of Systems  Constitutional: Negative.   HENT: Negative.   Eyes: Negative.   Respiratory: Negative.   Cardiovascular: Negative.   Gastrointestinal: Negative.   Genitourinary: Negative.   Musculoskeletal: Negative.   Psychiatric/Behavioral: Negative.        Objective:   Physical Exam Vitals signs and nursing note reviewed.  Constitutional:      Appearance: Normal appearance.  HENT:     Head:      Nose: Nose normal.  Cardiovascular:     Rate and Rhythm: Normal rate.  Pulmonary:     Effort: Pulmonary effort is normal.  Neurological:     Mental Status: He is alert. Mental status is at baseline.  Psychiatric:        Mood and Affect: Mood normal.        Behavior: Behavior normal.         Assessment & Plan:  Squamous cell carcinoma of scalp  Donated ACell applied.  Continue with KY dressing changes 1-2 times per day. Follow-up in 2 to 3 weeks.

## 2018-05-29 DIAGNOSIS — C4442 Squamous cell carcinoma of skin of scalp and neck: Secondary | ICD-10-CM | POA: Diagnosis not present

## 2018-06-02 DIAGNOSIS — C4442 Squamous cell carcinoma of skin of scalp and neck: Secondary | ICD-10-CM | POA: Diagnosis not present

## 2018-06-03 DIAGNOSIS — C4442 Squamous cell carcinoma of skin of scalp and neck: Secondary | ICD-10-CM | POA: Diagnosis not present

## 2018-06-04 DIAGNOSIS — C4442 Squamous cell carcinoma of skin of scalp and neck: Secondary | ICD-10-CM | POA: Diagnosis not present

## 2018-06-06 ENCOUNTER — Other Ambulatory Visit: Payer: Self-pay

## 2018-06-06 ENCOUNTER — Ambulatory Visit: Payer: PPO | Admitting: Plastic Surgery

## 2018-06-06 ENCOUNTER — Ambulatory Visit (INDEPENDENT_AMBULATORY_CARE_PROVIDER_SITE_OTHER): Payer: PPO | Admitting: Plastic Surgery

## 2018-06-06 ENCOUNTER — Encounter: Payer: Self-pay | Admitting: Plastic Surgery

## 2018-06-06 VITALS — BP 101/64 | HR 66 | Temp 98.9°F | Ht 70.0 in | Wt 204.0 lb

## 2018-06-06 DIAGNOSIS — C4442 Squamous cell carcinoma of skin of scalp and neck: Secondary | ICD-10-CM

## 2018-06-06 NOTE — Progress Notes (Signed)
   Subjective:    Patient ID: Patrick Coleman, male    DOB: 1940-04-29, 78 y.o.   MRN: [id number]  The patient is a 78 year old man here for follow-up on his scalp wound.  He underwent excision of a squamous cell carcinoma.  There was concern for bone involvement.  It was excised.  He has been set up with radiation treatment and it has begun.  He has been using Vaseline to the area.  It does not appear to be infected.   Review of Systems  Constitutional: Negative.   HENT: Negative.   Respiratory: Negative.   Cardiovascular: Negative.   Gastrointestinal: Negative.   Musculoskeletal: Negative.   Skin: Positive for wound.      Objective:   Physical Exam Vitals signs and nursing note reviewed.  HENT:     Head: Normocephalic.  Cardiovascular:     Rate and Rhythm: Normal rate.  Pulmonary:     Effort: Pulmonary effort is normal.  Skin:    Capillary Refill: Capillary refill takes less than 2 seconds.  Neurological:     Mental Status: He is alert and oriented to person, place, and time. Mental status is at baseline.  Psychiatric:        Mood and Affect: Mood normal.        Behavior: Behavior normal.        Assessment & Plan:  Squamous cell carcinoma of scalp Donated a cell was applied today.  Continue with Vaseline daily.  I would like to see him back in 2 weeks.

## 2018-06-09 DIAGNOSIS — E782 Mixed hyperlipidemia: Secondary | ICD-10-CM | POA: Diagnosis not present

## 2018-06-09 DIAGNOSIS — I10 Essential (primary) hypertension: Secondary | ICD-10-CM | POA: Diagnosis not present

## 2018-06-10 DIAGNOSIS — C4442 Squamous cell carcinoma of skin of scalp and neck: Secondary | ICD-10-CM | POA: Diagnosis not present

## 2018-06-11 DIAGNOSIS — C4442 Squamous cell carcinoma of skin of scalp and neck: Secondary | ICD-10-CM | POA: Diagnosis not present

## 2018-06-17 DIAGNOSIS — C4442 Squamous cell carcinoma of skin of scalp and neck: Secondary | ICD-10-CM | POA: Diagnosis not present

## 2018-06-18 ENCOUNTER — Telehealth: Payer: Self-pay | Admitting: Plastic Surgery

## 2018-06-18 NOTE — Telephone Encounter (Signed)
Called patient to confirm appointment scheduled for tomorrow. Patient answered the following questions: °1.Has the patient traveled outside of the state of [place] at all within the past 6 weeks? No °2.Does the patient have a fever or cough at all? No °3.Has the patient been tested for COVID? Had a positive COVID test? No °4. Has the patient been in contact with anyone who has tested positive? No ° °

## 2018-06-19 ENCOUNTER — Other Ambulatory Visit: Payer: Self-pay

## 2018-06-19 ENCOUNTER — Ambulatory Visit (INDEPENDENT_AMBULATORY_CARE_PROVIDER_SITE_OTHER): Payer: PPO | Admitting: Plastic Surgery

## 2018-06-19 ENCOUNTER — Encounter: Payer: Self-pay | Admitting: Plastic Surgery

## 2018-06-19 VITALS — BP 126/80 | HR 106 | Temp 98.7°F | Ht 70.0 in | Wt 204.0 lb

## 2018-06-19 DIAGNOSIS — C4442 Squamous cell carcinoma of skin of scalp and neck: Secondary | ICD-10-CM

## 2018-06-19 NOTE — Progress Notes (Signed)
   Subjective:    Patient ID: EMERALD GEHRES, male    DOB: 1940-03-17, 78 y.o.   MRN: [id number]  The patient is a 78 yrs old male here for follow up on his scalp defect.  Portions are improving.  There is some firmness that is concerning.  He has started radiation already.  We will have to wait until that is finished.  No sign of infection.     Review of Systems  Constitutional: Positive for appetite change.  HENT: Negative.   Eyes: Negative.   Respiratory: Negative.   Cardiovascular: Negative.   Gastrointestinal: Negative.   Genitourinary: Negative.   Musculoskeletal: Negative.   Skin: Positive for wound.  Psychiatric/Behavioral: Negative.        Objective:   Physical Exam Vitals signs and nursing note reviewed.  Constitutional:      Appearance: Normal appearance.  HENT:     Head: Normocephalic.  Cardiovascular:     Rate and Rhythm: Normal rate.  Pulmonary:     Effort: Pulmonary effort is normal. No respiratory distress.  Neurological:     Mental Status: He is alert. Mental status is at baseline.  Psychiatric:        Mood and Affect: Mood normal.        Behavior: Behavior normal.       Assessment & Plan:  Squamous cell carcinoma of scalp  Continue with a nonstick dressing and follow up in one month. Sooner if needed. Picture placed in the chart with patient permission.

## 2018-06-25 DIAGNOSIS — C4442 Squamous cell carcinoma of skin of scalp and neck: Secondary | ICD-10-CM | POA: Diagnosis not present

## 2018-07-02 DIAGNOSIS — C4442 Squamous cell carcinoma of skin of scalp and neck: Secondary | ICD-10-CM | POA: Diagnosis not present

## 2018-07-02 DIAGNOSIS — D72820 Lymphocytosis (symptomatic): Secondary | ICD-10-CM | POA: Diagnosis not present

## 2018-07-02 DIAGNOSIS — D649 Anemia, unspecified: Secondary | ICD-10-CM | POA: Diagnosis not present

## 2018-07-03 DIAGNOSIS — C4442 Squamous cell carcinoma of skin of scalp and neck: Secondary | ICD-10-CM | POA: Diagnosis not present

## 2018-07-09 DIAGNOSIS — C4442 Squamous cell carcinoma of skin of scalp and neck: Secondary | ICD-10-CM | POA: Diagnosis not present

## 2018-07-10 DIAGNOSIS — I10 Essential (primary) hypertension: Secondary | ICD-10-CM | POA: Diagnosis not present

## 2018-07-10 DIAGNOSIS — E782 Mixed hyperlipidemia: Secondary | ICD-10-CM | POA: Diagnosis not present

## 2018-07-11 DIAGNOSIS — C4442 Squamous cell carcinoma of skin of scalp and neck: Secondary | ICD-10-CM | POA: Diagnosis not present

## 2018-07-14 ENCOUNTER — Telehealth: Payer: Self-pay | Admitting: Plastic Surgery

## 2018-07-14 NOTE — Telephone Encounter (Signed)
Called patient to confirm appointment scheduled for tomorrow. Patient answered the following questions: °1.Has the patient traveled outside of the state of [place] at all within the past 6 weeks? No °2.Does the patient have a fever or cough at all? No °3.Has the patient been tested for COVID? Had a positive COVID test? No °4. Has the patient been in contact with anyone who has tested positive? No ° °

## 2018-07-15 ENCOUNTER — Ambulatory Visit: Payer: PPO | Admitting: Plastic Surgery

## 2018-07-15 ENCOUNTER — Ambulatory Visit (INDEPENDENT_AMBULATORY_CARE_PROVIDER_SITE_OTHER): Payer: PPO | Admitting: Nurse Practitioner

## 2018-07-15 ENCOUNTER — Other Ambulatory Visit: Payer: Self-pay

## 2018-07-15 ENCOUNTER — Encounter: Payer: Self-pay | Admitting: Nurse Practitioner

## 2018-07-15 VITALS — BP 119/72 | HR 65 | Temp 97.8°F | Ht 70.0 in | Wt 185.0 lb

## 2018-07-15 DIAGNOSIS — C4442 Squamous cell carcinoma of skin of scalp and neck: Secondary | ICD-10-CM

## 2018-07-15 NOTE — Progress Notes (Signed)
Patient ID: COYT GOVONI, male    DOB: 22-Jan-1941, 78 y.o.   MRN: [id number]   C.C.: scalp wound f/u   Patrick Coleman is a 78 yo male who presents today with his wife for follow up of scalp wound. 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 He is currently receiving radiation treatment to the area. His last scheduled radiation treatment is 07/17/18. He has been putting vaseline around the wound, but not on it. He has some pain. He is sleeping well.  There is very little drainage.   Review of Systems  Constitutional: Negative for fever.  HENT: Negative.   Respiratory: Negative.   Cardiovascular: Negative.   Gastrointestinal: Negative.   Genitourinary: Negative.   Musculoskeletal: Negative.   Skin:       Some drainage at scalp wound  Neurological: Negative.     Past Medical History:  Diagnosis Date  . Anxiety   . Bipolar disorder ([place])   . Depression   . Diabetes mellitus without complication ([place])    Type II  . History of kidney stones   . Hypertension   . Osteoarthritis   . Squamous cell carcinoma of scalp     Past Surgical History:  Procedure Laterality Date  . APPLICATION OF A-CELL OF HEAD/NECK N/A 01/13/2018   Procedure: APPLICATION OF A-CELL OF HEAD/NECK;  Surgeon: Wallace Going, DO;  Location: [place];  Service: Plastics;  Laterality: N/A;  . APPLICATION OF A-CELL OF HEAD/NECK N/A 02/24/2018   Procedure: with application of A-cell;  Surgeon: Wallace Going, DO;  Location: [place];  Service: Plastics;  Laterality: N/A;  . BACK SURGERY    . BASAL CELL CARCINOMA EXCISION N/A 04/28/2018   Procedure: Excision of skin cancer scalp with acell placement;  Surgeon: Wallace Going, DO;  Location: [place];  Service: Plastics;  Laterality: N/A;  . HERNIA REPAIR    . HIP FRACTURE SURGERY     Right  . LESION REMOVAL N/A 02/24/2018   Procedure: Excision of skull squamous cell carinoma;  Surgeon: Wallace Going, DO;   Location: [place];  Service: Plastics;  Laterality: N/A;  . LIPOMA EXCISION N/A 01/13/2018   Procedure: Excision of squamous cell carcinoma;  Surgeon: Wallace Going, DO;  Location: [place];  Service: Plastics;  Laterality: N/A;  . VASECTOMY        Current Outpatient Medications:  .  aspirin EC 81 MG tablet, Take 81 mg by mouth at bedtime., Disp: , Rfl:  .  atorvastatin (LIPITOR) 80 MG tablet, Take 80 mg by mouth at bedtime., Disp: , Rfl:  .  buPROPion (WELLBUTRIN SR) 100 MG 12 hr tablet, Take 100 mg by mouth at bedtime. , Disp: , Rfl: 4 .  carbidopa-levodopa (SINEMET IR) 25-100 MG tablet, Take 1 tablet by mouth 3 (three) times daily., Disp: , Rfl: 6 .  cholecalciferol (VITAMIN D) 1000 units tablet, Take 2,000 Units by mouth daily. , Disp: , Rfl:  .  doxazosin (CARDURA) 4 MG tablet, Take 4 mg by mouth at bedtime., Disp: , Rfl: 4 .  HYDROcodone-acetaminophen (NORCO) 5-325 MG tablet, Take 1 tablet by mouth every 6 (six) hours as needed for moderate pain., Disp: 30 tablet, Rfl: 0 .  lamoTRIgine (LAMICTAL) 150 MG tablet, Take 150 mg by mouth at bedtime., Disp: , Rfl: 3 .  lithium carbonate (ESKALITH) 450 MG CR tablet, Take 450 mg by mouth at bedtime. , Disp: , Rfl: 4 .  metoprolol tartrate (LOPRESSOR) 25 MG tablet, Take 25 mg by mouth every morning. , Disp: , Rfl: 7 .  Multiple Vitamin (MULTIVITAMIN WITH MINERALS) TABS tablet, Take 1 tablet by mouth daily. COMPLETE MULITIVITAMIN, Disp: , Rfl:  .  OLANZapine (ZYPREXA) 10 MG tablet, Take 15 mg by mouth at bedtime. , Disp: , Rfl: 3 .  sertraline (ZOLOFT) 50 MG tablet, Take 50 mg by mouth every morning. , Disp: , Rfl: 3 .  vitamin C (ASCORBIC ACID) 500 MG tablet, Take 500 mg by mouth daily., Disp: , Rfl:    Objective:   Vitals:   07/15/18 1311  BP: 119/72  Pulse: 65  Temp: 97.8 F (36.6 C)  SpO2: 98%    Physical Exam  General: alert, calm, no acute distress HEENT: see skin for scalp wound Neck: normal Chest: symmetrical rise and fall  Lungs: unlabored breathing Cardiac: +2 bilateral radial pulse Musculoskeletal: MAEx4, kyphosis Neuro: A&O x3, calm, cooperative, mild unsteady gait Skin: open scalp wound with exposed bone, small area of granulated tissue, moist, red surrounding scalp   Assessment & Plan:  Squamous cell carcinoma of scalp, s/p excision on 04/28/18  Damarrion has some wound healing since his last visit one month ago. No signs of infection. He will complete his radiation treatment on 07/17/18. Will be able to continue further wound care treatment after completion of radiation. Continue vaseline and dressing for now. Follow up in 1 month.   Picture placed in chart with patient's consent.  Alfredo Batty, NP

## 2018-07-16 DIAGNOSIS — C4442 Squamous cell carcinoma of skin of scalp and neck: Secondary | ICD-10-CM | POA: Diagnosis not present

## 2018-08-15 ENCOUNTER — Telehealth: Payer: Self-pay | Admitting: Plastic Surgery

## 2018-08-15 ENCOUNTER — Ambulatory Visit: Payer: PPO | Admitting: Plastic Surgery

## 2018-08-15 NOTE — Telephone Encounter (Signed)

## 2018-08-18 ENCOUNTER — Other Ambulatory Visit: Payer: Self-pay

## 2018-08-18 ENCOUNTER — Ambulatory Visit (INDEPENDENT_AMBULATORY_CARE_PROVIDER_SITE_OTHER): Payer: PPO | Admitting: Plastic Surgery

## 2018-08-18 ENCOUNTER — Encounter: Payer: Self-pay | Admitting: Plastic Surgery

## 2018-08-18 DIAGNOSIS — S0100XD Unspecified open wound of scalp, subsequent encounter: Secondary | ICD-10-CM

## 2018-08-18 DIAGNOSIS — S0100XA Unspecified open wound of scalp, initial encounter: Secondary | ICD-10-CM | POA: Insufficient documentation

## 2018-08-18 NOTE — Progress Notes (Signed)
   Subjective:    Patient ID: Patrick Coleman, male    DOB: 1940-05-05, 78 y.o.   MRN: [id number]  The patient is a 78 yrs old wm here with his wife for follow up on his scalp wound.  The wound looks good today.  There is some healing and a decrease in the periwound redness and swelling.  He has finished the radiation.  He has been using K-Y jelly to the area.  There is no sign of infection.  Today the patient states that he is not sleeping well and noticed that his legs are weaker.  He is finding it more difficult to get around.  He is open to physical therapy treatment.     Review of Systems  Constitutional: Positive for activity change. Negative for appetite change.  HENT: Negative for congestion.   Eyes: Negative.   Respiratory: Negative for chest tightness and shortness of breath.   Cardiovascular: Negative for chest pain.  Gastrointestinal: Negative for abdominal distention.  Genitourinary: Negative.   Skin: Positive for wound.  Psychiatric/Behavioral: Negative.        Objective:   Physical Exam Vitals signs and nursing note reviewed.  Constitutional:      Appearance: Normal appearance.  HENT:     Head: Normocephalic and atraumatic.   Cardiovascular:     Rate and Rhythm: Normal rate.  Pulmonary:     Effort: Pulmonary effort is normal.  Abdominal:     General: There is no distension.  Neurological:     Mental Status: He is alert and oriented to person, place, and time.  Psychiatric:        Mood and Affect: Mood normal.        Behavior: Behavior normal.         Assessment & Plan:  Open wound of scalp, unspecified open wound type, subsequent encounter Vaseline to the scalp wound daily.  May shower and get it wet.  Follow up in three weeks.   I placed a call to his PCP Dr. Quintin Alto at [phone] and requested further evaluation for physical therapy.Marland Kitchen

## 2018-08-19 ENCOUNTER — Other Ambulatory Visit (HOSPITAL_COMMUNITY): Payer: Self-pay | Admitting: Emergency Medicine

## 2018-08-19 ENCOUNTER — Ambulatory Visit (HOSPITAL_COMMUNITY)
Admission: RE | Admit: 2018-08-19 | Discharge: 2018-08-19 | Disposition: A | Discharge status: Home / Self Care | Payer: PPO | Source: Ambulatory Visit | Attending: Emergency Medicine | Admitting: Emergency Medicine

## 2018-08-19 DIAGNOSIS — E119 Type 2 diabetes mellitus without complications: Secondary | ICD-10-CM | POA: Diagnosis not present

## 2018-08-19 DIAGNOSIS — I10 Essential (primary) hypertension: Secondary | ICD-10-CM | POA: Diagnosis not present

## 2018-08-19 DIAGNOSIS — J189 Pneumonia, unspecified organism: Secondary | ICD-10-CM | POA: Diagnosis not present

## 2018-08-19 DIAGNOSIS — J690 Pneumonitis due to inhalation of food and vomit: Secondary | ICD-10-CM | POA: Diagnosis not present

## 2018-08-19 DIAGNOSIS — I6782 Cerebral ischemia: Secondary | ICD-10-CM | POA: Diagnosis not present

## 2018-08-19 DIAGNOSIS — R0602 Shortness of breath: Secondary | ICD-10-CM | POA: Diagnosis not present

## 2018-08-19 DIAGNOSIS — C4442 Squamous cell carcinoma of skin of scalp and neck: Secondary | ICD-10-CM | POA: Diagnosis not present

## 2018-08-19 DIAGNOSIS — R42 Dizziness and giddiness: Secondary | ICD-10-CM

## 2018-08-19 DIAGNOSIS — N390 Urinary tract infection, site not specified: Secondary | ICD-10-CM | POA: Diagnosis not present

## 2018-08-19 DIAGNOSIS — D696 Thrombocytopenia, unspecified: Secondary | ICD-10-CM | POA: Diagnosis not present

## 2018-08-19 DIAGNOSIS — Z85828 Personal history of other malignant neoplasm of skin: Secondary | ICD-10-CM | POA: Diagnosis not present

## 2018-08-19 DIAGNOSIS — J3489 Other specified disorders of nose and nasal sinuses: Secondary | ICD-10-CM | POA: Diagnosis not present

## 2018-08-19 DIAGNOSIS — R531 Weakness: Secondary | ICD-10-CM | POA: Diagnosis not present

## 2018-08-19 DIAGNOSIS — Z923 Personal history of irradiation: Secondary | ICD-10-CM | POA: Diagnosis not present

## 2018-08-19 DIAGNOSIS — F319 Bipolar disorder, unspecified: Secondary | ICD-10-CM | POA: Diagnosis not present

## 2018-08-19 DIAGNOSIS — L98494 Non-pressure chronic ulcer of skin of other sites with necrosis of bone: Secondary | ICD-10-CM | POA: Diagnosis not present

## 2018-08-19 DIAGNOSIS — C7951 Secondary malignant neoplasm of bone: Secondary | ICD-10-CM | POA: Diagnosis not present

## 2018-09-19 ENCOUNTER — Ambulatory Visit: Payer: PPO | Admitting: Plastic Surgery

## 2018-11-20 DIAGNOSIS — E1122 Type 2 diabetes mellitus with diabetic chronic kidney disease: Secondary | ICD-10-CM | POA: Diagnosis not present

## 2018-11-20 DIAGNOSIS — E7800 Pure hypercholesterolemia, unspecified: Secondary | ICD-10-CM | POA: Diagnosis not present

## 2018-11-20 DIAGNOSIS — E782 Mixed hyperlipidemia: Secondary | ICD-10-CM | POA: Diagnosis not present

## 2018-12-11 DEATH — deceased

## 2020-01-04 IMAGING — PT NUCLEAR MEDICINE PET IMAGE INITIAL (PI) SKULL BASE TO THIGH
1 of 8 series · 3 of 16 positions shown, 4 images · non-contrast
Comparison: None.

CLINICAL DATA: Initial treatment strategy for squamous cell
carcinoma scalp in neck.

EXAM:
NUCLEAR MEDICINE PET SKULL BASE TO THIGH
TECHNIQUE: Eleven mCi F-18 FDG was injected intravenously. Full-ring PET
imaging was performed from the skull to thigh after the radiotracer.
CT data was obtained and used for attenuation correction and
anatomic localization.
Fasting blood glucose: 83 mg/dl

[Series 4: ct sk_thigh 5.0 b31f · axial · 0.98mm/px · z∈[-782,+310]mm · 3 of 274 slices shown, 4 images]
[im 1/274  soft-tissue]
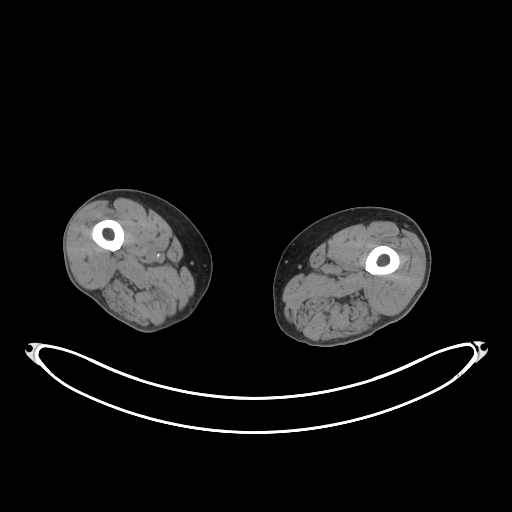
[im 1/274  bone]
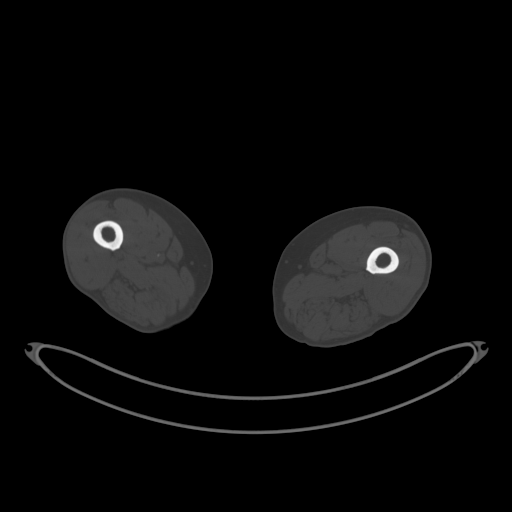
[im 137/274  soft-tissue]
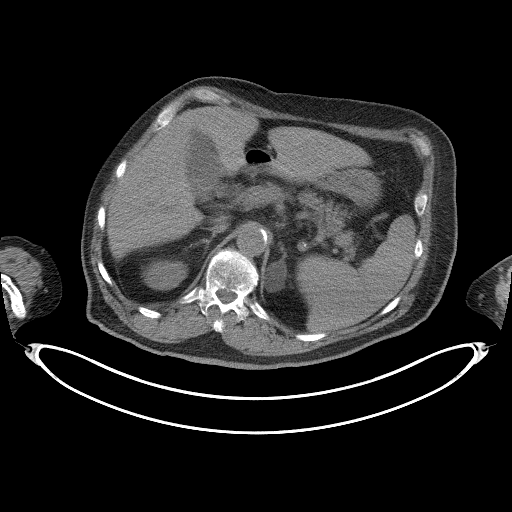
[im 274/274  soft-tissue]
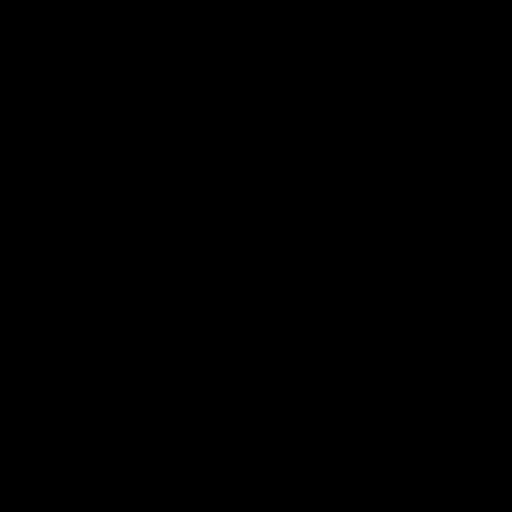

[3 of 16 positions shown; findings below may reference images not displayed]

FINDINGS: Mediastinal blood pool activity: SUV max

NECK: Vertex of the skull is included. There is an intensely
hypermetabolic erosive scalp lesion at the vertex which extends
through the calvarium to the inner table of the skull. Lesion is
intensely hypermetabolic with SUV max equal 36.4. This is resection
site of 5 cm skin lesion and 3 cm bone resection.

No hypermetabolic lymph nodes in the neck.

Incidental CT findings: Scalp resection which extends into the in
the calvarium with a 2.9 cm defect in the skull.

CHEST: No hypermetabolic mediastinal or hilar nodes. No suspicious
pulmonary nodules on the CT scan.

Incidental CT findings: none

ABDOMEN/PELVIS: No abnormal hypermetabolic activity within the
liver, pancreas, adrenal glands, or spleen. No hypermetabolic lymph
nodes in the abdomen or pelvis.

Incidental CT findings: Bilateral hydroceles, RIGHT greater than
LEFT.

SKELETON: No focal hypermetabolic activity to suggest skeletal
metastasis. Intense activity associated with the RIGHT hand between
the first and second digit. Favor muscular activity during
equilibration period.

Incidental CT findings: none
IMPRESSION: 1. Intensely hypermetabolic activity at the resection site at the
vertex. Intense activity is favored postsurgical inflammation
related to presumed graft material.

2.  No evidence of distant metastatic disease.

3.  Intense activity in LEFT hand is favored benign muscle activity.
